# Patient Record
Sex: Female | Born: 1990 | Race: White | Hispanic: No | Marital: Married | Smoking: Current every day smoker
Health system: Southern US, Community
[De-identification: ages and names within clinical notes are randomized; demographics above are authoritative.]

## PROBLEM LIST (undated history)

## (undated) DIAGNOSIS — R112 Nausea with vomiting, unspecified: Secondary | ICD-10-CM

## (undated) DIAGNOSIS — R569 Unspecified convulsions: Secondary | ICD-10-CM

## (undated) DIAGNOSIS — Z9889 Other specified postprocedural states: Secondary | ICD-10-CM

## (undated) HISTORY — PX: NO PAST SURGERIES: SHX2092

## (undated) HISTORY — PX: KNEE SURGERY: SHX244

## (undated) HISTORY — PX: WISDOM TOOTH EXTRACTION: SHX21

---

## 2016-06-25 HISTORY — PX: CHOLECYSTECTOMY: SHX55

## 2019-01-15 DIAGNOSIS — Z720 Tobacco use: Secondary | ICD-10-CM | POA: Insufficient documentation

## 2019-06-09 DIAGNOSIS — Z8719 Personal history of other diseases of the digestive system: Secondary | ICD-10-CM | POA: Insufficient documentation

## 2019-06-11 LAB — OB RESULTS CONSOLE ABO/RH: RH Type: POSITIVE

## 2019-06-11 LAB — OB RESULTS CONSOLE HIV ANTIBODY (ROUTINE TESTING): HIV: NONREACTIVE

## 2019-06-11 LAB — OB RESULTS CONSOLE HEPATITIS B SURFACE ANTIGEN: Hepatitis B Surface Ag: NEGATIVE

## 2019-06-11 LAB — OB RESULTS CONSOLE RUBELLA ANTIBODY, IGM: Rubella: IMMUNE

## 2019-10-08 ENCOUNTER — Ambulatory Visit: Payer: Self-pay | Attending: Internal Medicine

## 2019-10-08 DIAGNOSIS — Z23 Encounter for immunization: Secondary | ICD-10-CM

## 2019-10-08 NOTE — Progress Notes (Signed)
   Covid-19 Vaccination Clinic  Name:  Cassandra Thomas    MRN: [id number] DOB: 1990/11/23  10/08/2019  Cassandra Thomas was observed post Covid-19 immunization for 15 minutes without incident. She was provided with Vaccine Information Sheet and instruction to access the V-Safe system.   Cassandra Thomas was instructed to call 911 with any severe reactions post vaccine: Marland Kitchen Difficulty breathing  . Swelling of face and throat  . A fast heartbeat  . A bad rash all over body  . Dizziness and weakness   Immunizations Administered    Name Date Dose VIS Date Route   Pfizer COVID-19 Vaccine 10/08/2019  5:04 PM 0.3 mL 06/05/2019 Intramuscular   Manufacturer: [employer], [employer]   Lot: [id number]   NDC: [address]-6

## 2019-11-02 ENCOUNTER — Ambulatory Visit: Payer: Self-pay

## 2019-11-04 ENCOUNTER — Ambulatory Visit: Payer: 59 | Attending: Internal Medicine

## 2019-11-04 DIAGNOSIS — Z23 Encounter for immunization: Secondary | ICD-10-CM

## 2019-11-04 NOTE — Progress Notes (Signed)
   Covid-19 Vaccination Clinic  Name:  Cassandra Thomas    MRN: [id number] DOB: 1990/09/22  11/04/2019  [name] was observed post Covid-19 immunization for 15 minutes without incident. She was provided with Vaccine Information Sheet and instruction to access the V-Safe system.   [name] was instructed to call 911 with any severe reactions post vaccine: Marland Kitchen Difficulty breathing  . Swelling of face and throat  . A fast heartbeat  . A bad rash all over body  . Dizziness and weakness   Immunizations Administered    Name Date Dose VIS Date Route   Pfizer COVID-19 Vaccine 11/04/2019  4:38 PM 0.3 mL 08/19/2018 Intramuscular   Manufacturer: [employer], [employer]   Lot: [id number]   NDC: [address]-9

## 2019-11-07 ENCOUNTER — Other Ambulatory Visit: Payer: Self-pay

## 2019-11-07 ENCOUNTER — Inpatient Hospital Stay (HOSPITAL_COMMUNITY)
Admission: AD | Admit: 2019-11-07 | Discharge: 2019-11-10 | DRG: 786 | Disposition: A | Discharge status: Home / Self Care | Payer: 59 | Source: Ambulatory Visit | Attending: Obstetrics & Gynecology | Admitting: Obstetrics & Gynecology

## 2019-11-07 ENCOUNTER — Encounter (HOSPITAL_COMMUNITY)
Admission: AD | Disposition: A | Discharge status: Home / Self Care | Payer: Self-pay | Source: Ambulatory Visit | Attending: Obstetrics & Gynecology

## 2019-11-07 ENCOUNTER — Inpatient Hospital Stay (HOSPITAL_COMMUNITY): Payer: 59 | Admitting: Anesthesiology

## 2019-11-07 ENCOUNTER — Encounter (HOSPITAL_COMMUNITY): Payer: Self-pay | Admitting: Obstetrics & Gynecology

## 2019-11-07 DIAGNOSIS — O99323 Drug use complicating pregnancy, third trimester: Secondary | ICD-10-CM | POA: Diagnosis present

## 2019-11-07 DIAGNOSIS — F1290 Cannabis use, unspecified, uncomplicated: Secondary | ICD-10-CM | POA: Diagnosis present

## 2019-11-07 DIAGNOSIS — O99354 Diseases of the nervous system complicating childbirth: Secondary | ICD-10-CM | POA: Diagnosis present

## 2019-11-07 DIAGNOSIS — O328XX0 Maternal care for other malpresentation of fetus, not applicable or unspecified: Secondary | ICD-10-CM | POA: Diagnosis not present

## 2019-11-07 DIAGNOSIS — O99334 Smoking (tobacco) complicating childbirth: Secondary | ICD-10-CM | POA: Diagnosis present

## 2019-11-07 DIAGNOSIS — F17290 Nicotine dependence, other tobacco product, uncomplicated: Secondary | ICD-10-CM | POA: Diagnosis present

## 2019-11-07 DIAGNOSIS — G40909 Epilepsy, unspecified, not intractable, without status epilepticus: Secondary | ICD-10-CM

## 2019-11-07 DIAGNOSIS — O321XX0 Maternal care for breech presentation, not applicable or unspecified: Secondary | ICD-10-CM | POA: Diagnosis present

## 2019-11-07 DIAGNOSIS — Z3A36 36 weeks gestation of pregnancy: Secondary | ICD-10-CM

## 2019-11-07 HISTORY — DX: Other specified postprocedural states: R11.2

## 2019-11-07 HISTORY — DX: Unspecified convulsions: R56.9

## 2019-11-07 HISTORY — DX: Nausea with vomiting, unspecified: Z98.890

## 2019-11-07 HISTORY — DX: Maternal care for breech presentation, not applicable or unspecified: O32.1XX0

## 2019-11-07 LAB — URINALYSIS, ROUTINE W REFLEX MICROSCOPIC
Bilirubin Urine: NEGATIVE
Glucose, UA: NEGATIVE mg/dL
Ketones, ur: 80 mg/dL — AB
Nitrite: NEGATIVE
Protein, ur: NEGATIVE mg/dL
Specific Gravity, Urine: 1.008 (ref 1.005–1.030)
pH: 7 (ref 5.0–8.0)

## 2019-11-07 LAB — RAPID URINE DRUG SCREEN, HOSP PERFORMED
Amphetamines: NOT DETECTED
Barbiturates: NOT DETECTED
Benzodiazepines: NOT DETECTED
Cocaine: NOT DETECTED
Opiates: NOT DETECTED
Tetrahydrocannabinol: POSITIVE — AB

## 2019-11-07 LAB — CBC
HCT: 37.7 % (ref 36.0–46.0)
Hemoglobin: 12.5 g/dL (ref 12.0–15.0)
MCH: 30.9 pg (ref 26.0–34.0)
MCHC: 33.2 g/dL (ref 30.0–36.0)
MCV: 93.3 fL (ref 80.0–100.0)
Platelets: 148 10*3/uL — ABNORMAL LOW (ref 150–400)
RBC: 4.04 MIL/uL (ref 3.87–5.11)
RDW: 12.9 % (ref 11.5–15.5)
WBC: 12.8 10*3/uL — ABNORMAL HIGH (ref 4.0–10.5)
nRBC: 0 % (ref 0.0–0.2)

## 2019-11-07 LAB — TYPE AND SCREEN
ABO/RH(D): O POS
Antibody Screen: NEGATIVE

## 2019-11-07 LAB — ABO/RH: ABO/RH(D): O POS

## 2019-11-07 SURGERY — Surgical Case
Anesthesia: Spinal | Site: Abdomen | Wound class: Clean Contaminated

## 2019-11-07 MED ORDER — ACETAMINOPHEN 325 MG PO TABS
650.0000 mg | ORAL_TABLET | Freq: Four times a day (QID) | ORAL | Status: DC | PRN
  Administered 2019-11-08: 650 mg via ORAL
  Filled 2019-11-07: qty 2

## 2019-11-07 MED ORDER — FENTANYL CITRATE (PF) 100 MCG/2ML IJ SOLN
INTRAMUSCULAR | Status: AC
  Filled 2019-11-07: qty 2

## 2019-11-07 MED ORDER — WITCH HAZEL-GLYCERIN EX PADS: 1.0000 "application " | MEDICATED_PAD | CUTANEOUS | Status: DC | PRN

## 2019-11-07 MED ORDER — GENERIC EXTERNAL MEDICATION
Status: DC
Start: ? — End: 2019-11-07

## 2019-11-07 MED ORDER — MORPHINE SULFATE (PF) 0.5 MG/ML IJ SOLN
INTRAMUSCULAR | Status: AC
  Filled 2019-11-07: qty 10

## 2019-11-07 MED ORDER — MEPERIDINE HCL 25 MG/ML IJ SOLN: 6.2500 mg | INTRAMUSCULAR | Status: DC | PRN

## 2019-11-07 MED ORDER — OXYTOCIN 40 UNITS IN NORMAL SALINE INFUSION - SIMPLE MED
INTRAVENOUS | Status: DC | PRN
  Administered 2019-11-07: 40 [IU] via INTRAVENOUS

## 2019-11-07 MED ORDER — LACTATED RINGERS IV SOLN
125.00 | INTRAVENOUS | Status: DC
Start: ? — End: 2019-11-07

## 2019-11-07 MED ORDER — OXYTOCIN 40 UNITS IN NORMAL SALINE INFUSION - SIMPLE MED
INTRAVENOUS | Status: AC
  Filled 2019-11-07: qty 1000

## 2019-11-07 MED ORDER — ONDANSETRON HCL 4 MG/2ML IJ SOLN: 4.0000 mg | Freq: Once | INTRAMUSCULAR | Status: DC | PRN

## 2019-11-07 MED ORDER — SODIUM CHLORIDE 0.9 % IV SOLN
INTRAVENOUS | Status: DC | PRN
Start: 2019-11-07 — End: 2019-11-07

## 2019-11-07 MED ORDER — PRENATAL MULTIVITAMIN CH
1.0000 | ORAL_TABLET | Freq: Every day | ORAL | Status: DC
  Administered 2019-11-08 – 2019-11-10 (×3): 1 via ORAL
  Filled 2019-11-07 (×3): qty 1

## 2019-11-07 MED ORDER — NALOXONE HCL 0.4 MG/ML IJ SOLN: 0.4000 mg | INTRAMUSCULAR | Status: DC | PRN

## 2019-11-07 MED ORDER — OXYCODONE HCL 5 MG PO TABS
5.0000 mg | ORAL_TABLET | ORAL | Status: DC | PRN
  Administered 2019-11-08 (×2): 5 mg via ORAL
  Administered 2019-11-09 (×2): 10 mg via ORAL
  Administered 2019-11-09: 5 mg via ORAL
  Administered 2019-11-09 – 2019-11-10 (×2): 10 mg via ORAL
  Administered 2019-11-10: 5 mg via ORAL
  Filled 2019-11-07: qty 2
  Filled 2019-11-07 (×4): qty 1
  Filled 2019-11-07 (×3): qty 2

## 2019-11-07 MED ORDER — BUPIVACAINE IN DEXTROSE 0.75-8.25 % IT SOLN
INTRATHECAL | Status: DC | PRN
  Administered 2019-11-07: 1.6 mL via INTRATHECAL

## 2019-11-07 MED ORDER — PHENYLEPHRINE HCL-NACL 20-0.9 MG/250ML-% IV SOLN
INTRAVENOUS | Status: DC | PRN
  Administered 2019-11-07: 60 ug/min via INTRAVENOUS

## 2019-11-07 MED ORDER — SIMETHICONE 80 MG PO CHEW
80.0000 mg | CHEWABLE_TABLET | ORAL | Status: DC
  Administered 2019-11-09 – 2019-11-10 (×2): 80 mg via ORAL
  Filled 2019-11-07 (×3): qty 1

## 2019-11-07 MED ORDER — DIPHENHYDRAMINE HCL 50 MG/ML IJ SOLN: 12.5000 mg | INTRAMUSCULAR | Status: DC | PRN

## 2019-11-07 MED ORDER — FAMOTIDINE 20 MG/2ML IV SOLN
20.00 | INTRAVENOUS | Status: DC
Start: ? — End: 2019-11-07

## 2019-11-07 MED ORDER — ACETAMINOPHEN 325 MG PO TABS
650.00 | ORAL_TABLET | ORAL | Status: DC
Start: ? — End: 2019-11-07

## 2019-11-07 MED ORDER — SENNOSIDES-DOCUSATE SODIUM 8.6-50 MG PO TABS
2.0000 | ORAL_TABLET | ORAL | Status: DC
  Administered 2019-11-09 – 2019-11-10 (×2): 2 via ORAL
  Filled 2019-11-07 (×3): qty 2

## 2019-11-07 MED ORDER — NALOXONE HCL 4 MG/10ML IJ SOLN
1.0000 ug/kg/h | INTRAVENOUS | Status: DC | PRN
  Filled 2019-11-07: qty 5

## 2019-11-07 MED ORDER — LACTATED RINGERS IV SOLN
1000.00 | INTRAVENOUS | Status: DC
Start: ? — End: 2019-11-07

## 2019-11-07 MED ORDER — DIPHENHYDRAMINE HCL 25 MG PO CAPS: 25.0000 mg | ORAL_CAPSULE | ORAL | Status: DC | PRN

## 2019-11-07 MED ORDER — LEVETIRACETAM 500 MG PO TABS
500.0000 mg | ORAL_TABLET | Freq: Two times a day (BID) | ORAL | Status: DC
  Administered 2019-11-08 – 2019-11-10 (×6): 500 mg via ORAL
  Filled 2019-11-07 (×8): qty 1

## 2019-11-07 MED ORDER — BENZOCAINE-MENTHOL 15-3.6 MG MT LOZG
1.00 | LOZENGE | OROMUCOSAL | Status: DC
Start: ? — End: 2019-11-07

## 2019-11-07 MED ORDER — KETOROLAC TROMETHAMINE 30 MG/ML IJ SOLN
30.0000 mg | Freq: Four times a day (QID) | INTRAMUSCULAR | Status: AC
  Administered 2019-11-08 (×2): 30 mg via INTRAVENOUS
  Filled 2019-11-07 (×2): qty 1

## 2019-11-07 MED ORDER — TETANUS-DIPHTH-ACELL PERTUSSIS 5-2.5-18.5 LF-MCG/0.5 IM SUSP: 0.5000 mL | Freq: Once | INTRAMUSCULAR | Status: DC

## 2019-11-07 MED ORDER — COCONUT OIL OIL: 1.0000 "application " | TOPICAL_OIL | Status: DC | PRN

## 2019-11-07 MED ORDER — TERBUTALINE SULFATE 1 MG/ML IJ SOLN
0.2500 mg | Freq: Once | INTRAMUSCULAR | Status: AC
  Administered 2019-11-07: 0.25 mg via SUBCUTANEOUS
  Filled 2019-11-07: qty 1

## 2019-11-07 MED ORDER — SIMETHICONE 80 MG PO CHEW
80.0000 mg | CHEWABLE_TABLET | Freq: Three times a day (TID) | ORAL | Status: DC
  Administered 2019-11-08 – 2019-11-10 (×7): 80 mg via ORAL
  Filled 2019-11-07 (×7): qty 1

## 2019-11-07 MED ORDER — SODIUM CHLORIDE 0.9 % IV SOLN
2.0000 g | Freq: Once | INTRAVENOUS | Status: AC
  Administered 2019-11-07: 2 g via INTRAVENOUS
  Filled 2019-11-07: qty 2

## 2019-11-07 MED ORDER — ONDANSETRON HCL 4 MG/2ML IJ SOLN: 4.0000 mg | Freq: Three times a day (TID) | INTRAMUSCULAR | Status: DC | PRN

## 2019-11-07 MED ORDER — SOD CITRATE-CITRIC ACID 500-334 MG/5ML PO SOLN
30.0000 mL | Freq: Once | ORAL | Status: AC
  Administered 2019-11-07: 30 mL via ORAL
  Filled 2019-11-07: qty 30

## 2019-11-07 MED ORDER — SCOPOLAMINE 1 MG/3DAYS TD PT72
1.0000 | MEDICATED_PATCH | Freq: Once | TRANSDERMAL | Status: DC
  Administered 2019-11-07: 1.5 mg via TRANSDERMAL

## 2019-11-07 MED ORDER — ZOLPIDEM TARTRATE 5 MG PO TABS: 5.0000 mg | ORAL_TABLET | Freq: Every evening | ORAL | Status: DC | PRN

## 2019-11-07 MED ORDER — MAGNESIUM HYDROXIDE 400 MG/5ML PO SUSP
30.00 | ORAL | Status: DC
Start: ? — End: 2019-11-07

## 2019-11-07 MED ORDER — MORPHINE SULFATE (PF) 0.5 MG/ML IJ SOLN
INTRAMUSCULAR | Status: DC | PRN
  Administered 2019-11-07: .15 mg via INTRATHECAL

## 2019-11-07 MED ORDER — LACTATED RINGERS IV BOLUS
1000.0000 mL | Freq: Once | INTRAVENOUS | Status: AC
  Administered 2019-11-07: 1000 mL via INTRAVENOUS

## 2019-11-07 MED ORDER — KETOROLAC TROMETHAMINE 30 MG/ML IJ SOLN
30.0000 mg | Freq: Once | INTRAMUSCULAR | Status: AC | PRN
  Administered 2019-11-07: 30 mg via INTRAVENOUS

## 2019-11-07 MED ORDER — SCOPOLAMINE 1 MG/3DAYS TD PT72
MEDICATED_PATCH | TRANSDERMAL | Status: AC
  Filled 2019-11-07: qty 1

## 2019-11-07 MED ORDER — MENTHOL 3 MG MT LOZG: 1.0000 | LOZENGE | OROMUCOSAL | Status: DC | PRN

## 2019-11-07 MED ORDER — KETOROLAC TROMETHAMINE 30 MG/ML IJ SOLN
INTRAMUSCULAR | Status: AC
  Filled 2019-11-07: qty 1

## 2019-11-07 MED ORDER — FENTANYL CITRATE (PF) 100 MCG/2ML IJ SOLN
25.0000 ug | INTRAMUSCULAR | Status: DC | PRN
  Administered 2019-11-07: 50 ug via INTRAVENOUS
  Administered 2019-11-07: 25 ug via INTRAVENOUS
  Administered 2019-11-07: 50 ug via INTRAVENOUS

## 2019-11-07 MED ORDER — BUTORPHANOL TARTRATE 1 MG/ML IJ SOLN
1.00 | INTRAMUSCULAR | Status: DC
Start: ? — End: 2019-11-07

## 2019-11-07 MED ORDER — ALBUTEROL SULFATE (2.5 MG/3ML) 0.083% IN NEBU
2.50 | INHALATION_SOLUTION | RESPIRATORY_TRACT | Status: DC
Start: ? — End: 2019-11-07

## 2019-11-07 MED ORDER — SODIUM CHLORIDE 0.9% FLUSH: 3.0000 mL | INTRAVENOUS | Status: DC | PRN

## 2019-11-07 MED ORDER — ONDANSETRON HCL 4 MG/2ML IJ SOLN
INTRAMUSCULAR | Status: AC
  Filled 2019-11-07: qty 2

## 2019-11-07 MED ORDER — OXYTOCIN 40 UNITS IN NORMAL SALINE INFUSION - SIMPLE MED: 2.5000 [IU]/h | INTRAVENOUS | Status: AC

## 2019-11-07 MED ORDER — DIBUCAINE (PERIANAL) 1 % EX OINT: 1.0000 "application " | TOPICAL_OINTMENT | CUTANEOUS | Status: DC | PRN

## 2019-11-07 MED ORDER — SIMETHICONE 80 MG PO CHEW: 80.0000 mg | CHEWABLE_TABLET | ORAL | Status: DC | PRN

## 2019-11-07 MED ORDER — SALINE NASAL SPRAY 0.65 % NA SOLN
2.00 | NASAL | Status: DC
Start: ? — End: 2019-11-07

## 2019-11-07 MED ORDER — IBUPROFEN 800 MG PO TABS
800.0000 mg | ORAL_TABLET | Freq: Four times a day (QID) | ORAL | Status: DC
  Administered 2019-11-08 – 2019-11-10 (×8): 800 mg via ORAL
  Filled 2019-11-07 (×8): qty 1

## 2019-11-07 MED ORDER — LEVETIRACETAM 500 MG PO TABS
500.00 | ORAL_TABLET | ORAL | Status: DC
Start: 2019-11-07 — End: 2019-11-07

## 2019-11-07 MED ORDER — DSS 100 MG PO CAPS
100.00 | ORAL_CAPSULE | ORAL | Status: DC
Start: 2019-11-07 — End: 2019-11-07

## 2019-11-07 MED ORDER — FAMOTIDINE IN NACL 20-0.9 MG/50ML-% IV SOLN
20.0000 mg | Freq: Once | INTRAVENOUS | Status: AC
  Administered 2019-11-07: 20 mg via INTRAVENOUS
  Filled 2019-11-07: qty 50

## 2019-11-07 MED ORDER — ONDANSETRON HCL 4 MG/2ML IJ SOLN
INTRAMUSCULAR | Status: DC | PRN
  Administered 2019-11-07: 4 mg via INTRAVENOUS

## 2019-11-07 MED ORDER — LACTATED RINGERS IV SOLN
500.00 | INTRAVENOUS | Status: DC
Start: ? — End: 2019-11-07

## 2019-11-07 MED ORDER — DIPHENHYDRAMINE HCL 25 MG PO CAPS: 25.0000 mg | ORAL_CAPSULE | Freq: Four times a day (QID) | ORAL | Status: DC | PRN

## 2019-11-07 MED ORDER — LACTATED RINGERS IV SOLN: INTRAVENOUS | Status: DC

## 2019-11-07 MED ORDER — ENOXAPARIN SODIUM 40 MG/0.4ML ~~LOC~~ SOLN
40.0000 mg | SUBCUTANEOUS | Status: DC
  Administered 2019-11-09: 40 mg via SUBCUTANEOUS
  Filled 2019-11-07: qty 0.4

## 2019-11-07 MED ORDER — PSYLLIUM 0.52 G PO CAPS
2.00 | ORAL_CAPSULE | ORAL | Status: DC
Start: ? — End: 2019-11-07

## 2019-11-07 MED ORDER — FENTANYL CITRATE (PF) 100 MCG/2ML IJ SOLN
INTRAMUSCULAR | Status: DC | PRN
  Administered 2019-11-07: 15 ug via INTRATHECAL

## 2019-11-07 MED ORDER — SOD CITRATE-CITRIC ACID 500-334 MG/5ML PO SOLN
30.00 | ORAL | Status: DC
Start: ? — End: 2019-11-07

## 2019-11-07 MED ORDER — PRENATAL 19 PO TABS
1.00 | ORAL_TABLET | ORAL | Status: DC
Start: 2019-11-08 — End: 2019-11-07

## 2019-11-07 MED ORDER — DEXAMETHASONE SODIUM PHOSPHATE 10 MG/ML IJ SOLN
INTRAMUSCULAR | Status: DC | PRN
  Administered 2019-11-07: 10 mg via INTRAVENOUS

## 2019-11-07 MED ORDER — PHENYLEPHRINE HCL-NACL 20-0.9 MG/250ML-% IV SOLN
INTRAVENOUS | Status: AC
  Filled 2019-11-07: qty 250

## 2019-11-07 MED ORDER — ZOLPIDEM TARTRATE 5 MG PO TABS
5.00 | ORAL_TABLET | ORAL | Status: DC
Start: ? — End: 2019-11-07

## 2019-11-07 MED ORDER — DEXAMETHASONE SODIUM PHOSPHATE 10 MG/ML IJ SOLN
INTRAMUSCULAR | Status: AC
  Filled 2019-11-07: qty 1

## 2019-11-07 SURGICAL SUPPLY — 33 items
CHLORAPREP W/TINT 26ML (MISCELLANEOUS) ×3 IMPLANT
CLAMP CORD UMBIL (MISCELLANEOUS) IMPLANT
CLOTH BEACON ORANGE TIMEOUT ST (SAFETY) ×3 IMPLANT
DRSG OPSITE POSTOP 4X10 (GAUZE/BANDAGES/DRESSINGS) ×3 IMPLANT
ELECT REM PT RETURN 9FT ADLT (ELECTROSURGICAL) ×3
ELECTRODE REM PT RTRN 9FT ADLT (ELECTROSURGICAL) ×1 IMPLANT
EXTRACTOR VACUUM M CUP 4 TUBE (SUCTIONS) IMPLANT
EXTRACTOR VACUUM M CUP 4' TUBE (SUCTIONS)
GLOVE BIOGEL PI IND STRL 7.0 (GLOVE) ×5 IMPLANT
GLOVE BIOGEL PI INDICATOR 7.0 (GLOVE) ×10
GLOVE ECLIPSE 7.0 STRL STRAW (GLOVE) ×12 IMPLANT
GOWN STRL REUS W/TWL LRG LVL3 (GOWN DISPOSABLE) ×6 IMPLANT
KIT ABG SYR 3ML LUER SLIP (SYRINGE) IMPLANT
NEEDLE HYPO 22GX1.5 SAFETY (NEEDLE) ×3 IMPLANT
NEEDLE HYPO 25X5/8 SAFETYGLIDE (NEEDLE) ×3 IMPLANT
NS IRRIG 1000ML POUR BTL (IV SOLUTION) ×3 IMPLANT
PACK C SECTION WH (CUSTOM PROCEDURE TRAY) ×3 IMPLANT
PAD ABD 7.5X8 STRL (GAUZE/BANDAGES/DRESSINGS) ×3 IMPLANT
PAD OB MATERNITY 4.3X12.25 (PERSONAL CARE ITEMS) ×3 IMPLANT
PENCIL SMOKE EVAC W/HOLSTER (ELECTROSURGICAL) ×3 IMPLANT
RTRCTR C-SECT PINK 25CM LRG (MISCELLANEOUS) IMPLANT
SPONGE GAUZE 4X4 12PLY (GAUZE/BANDAGES/DRESSINGS) ×3 IMPLANT
SUT PDS AB 0 CTX 36 PDP370T (SUTURE) IMPLANT
SUT PLAIN 2 0 XLH (SUTURE) IMPLANT
SUT VIC AB 0 CTX 36 (SUTURE) ×4
SUT VIC AB 0 CTX36XBRD ANBCTRL (SUTURE) ×2 IMPLANT
SUT VIC AB 2-0 CT1 27 (SUTURE) ×2
SUT VIC AB 2-0 CT1 TAPERPNT 27 (SUTURE) ×1 IMPLANT
SUT VIC AB 4-0 KS 27 (SUTURE) ×3 IMPLANT
SYR CONTROL 10ML LL (SYRINGE) ×3 IMPLANT
TOWEL OR 17X24 6PK STRL BLUE (TOWEL DISPOSABLE) ×3 IMPLANT
TRAY FOLEY W/BAG SLVR 14FR LF (SET/KITS/TRAYS/PACK) ×3 IMPLANT
WATER STERILE IRR 1000ML POUR (IV SOLUTION) ×3 IMPLANT

## 2019-11-07 NOTE — Transfer of Care (Signed)
Immediate Anesthesia Transfer of Care Note  Patient: Cassandra Thomas  Procedure(s) Performed: CESAREAN SECTION (N/A Abdomen)  Patient Location: PACU  Anesthesia Type:Spinal  Level of Consciousness: awake, alert , oriented and patient cooperative  Airway & Oxygen Therapy: Patient Spontanous Breathing  Post-op Assessment: Report given to RN and Post -op Vital signs reviewed and stable  Post vital signs: Reviewed and stable  Last Vitals:  Vitals Value Taken Time  BP 126/102 11/07/19 2047  Temp    Pulse 87 11/07/19 2049  Resp 17 11/07/19 2049  SpO2 100 % 11/07/19 2049  Vitals shown include unvalidated device data.  Last Pain:  Vitals:   11/07/19 1846  TempSrc:   PainSc: 2          Complications: No apparent anesthesia complications

## 2019-11-07 NOTE — Anesthesia Procedure Notes (Signed)
Spinal  Patient location during procedure: OR Staffing Performed: anesthesiologist  Anesthesiologist: Julietta Batterman E, MD Preanesthetic Checklist Completed: patient identified, IV checked, risks and benefits discussed, surgical consent, monitors and equipment checked, pre-op evaluation and timeout performed Spinal Block Patient position: sitting Prep: DuraPrep and site prepped and draped Patient monitoring: continuous pulse ox, blood pressure and heart rate Approach: midline Location: L3-4 Injection technique: single-shot Needle Needle type: Pencan  Needle gauge: 24 G Needle length: 9 cm Additional Notes Functioning IV was confirmed and monitors were applied. Sterile prep and drape, including hand hygiene and sterile gloves were used. The patient was positioned and the spine was prepped. The skin was anesthetized with lidocaine.  Free flow of clear CSF was obtained prior to injecting local anesthetic into the CSF. The needle was carefully withdrawn. The patient tolerated the procedure well.      

## 2019-11-07 NOTE — Anesthesia Postprocedure Evaluation (Signed)
Anesthesia Post Note  Patient: Cassandra Thomas  Procedure(s) Performed: CESAREAN SECTION (N/A Abdomen)     Patient location during evaluation: PACU Anesthesia Type: Spinal Level of consciousness: oriented and awake and alert Pain management: pain level controlled Vital Signs Assessment: post-procedure vital signs reviewed and stable Respiratory status: spontaneous breathing, respiratory function stable and nonlabored ventilation Cardiovascular status: blood pressure returned to baseline and stable Postop Assessment: no headache, no backache, no apparent nausea or vomiting and spinal receding Anesthetic complications: no    Last Vitals:  Vitals:   11/07/19 2120 11/07/19 2130  BP: 116/76 116/79  Pulse: 69 64  Resp: 18 16  Temp:    SpO2: 100% 100%    Last Pain:  Vitals:   11/07/19 2120  TempSrc:   PainSc: 4    Pain Goal:                Epidural/Spinal Function Cutaneous sensation: Able to Wiggle Toes (11/07/19 2120), Patient able to flex knees: Yes (11/07/19 2120), Patient able to lift hips off bed: No (11/07/19 2120), Back pain beyond tenderness at insertion site: No (11/07/19 2120), Progressively worsening motor and/or sensory loss: No (11/07/19 2120), Bowel and/or bladder incontinence post epidural: No (11/07/19 2120)  Lucretia Kern

## 2019-11-07 NOTE — Op Note (Addendum)
Claudius Sis PROCEDURE DATE: 11/07/2019  PREOPERATIVE DIAGNOSES: Intrauterine pregnancy at [redacted]w[redacted]d weeks gestation; breech presentation and laboring  POSTOPERATIVE DIAGNOSES: The same  PROCEDURE: Primary Low Transverse Cesarean Section  SURGEON:  Dr. Jaynie Collins - Primary Dr. Jerilynn Thomas - Fellow  ANESTHESIOLOGY TEAM: Anesthesiologist: Lucretia Kern, MD CRNA: Orlie Pollen, CRNA  INDICATIONS: Cassandra Thomas is a 29 y.o. [phone] at [redacted]w[redacted]d here for cesarean section secondary to the indications listed under preoperative diagnoses; please see preoperative note for further details.  The risks of cesarean section were discussed with the patient including but were not limited to: bleeding which may require transfusion or reoperation; infection which may require antibiotics; injury to bowel, bladder, ureters or other surrounding organs; injury to the fetus; need for additional procedures including hysterectomy in the event of a life-threatening hemorrhage; placental abnormalities wth subsequent pregnancies, incisional problems, thromboembolic phenomenon and other postoperative/anesthesia complications.   The patient concurred with the proposed plan, giving informed written consent for the procedure.    FINDINGS:  Viable female infant in complete breech presentation. Clear amniotic fluid.  Intact placenta, three vessel cord.  Normal uterus, fallopian tubes and ovaries bilaterally. APGAR (1 MIN): 9   APGAR (5 MINS): 9   APGAR (10 MINS):    ANESTHESIA: Spinal INTRAVENOUS FLUIDS: 1000 ml   ESTIMATED BLOOD LOSS: 333 ml URINE OUTPUT:  100 ml SPECIMENS: Placenta sent to L&D COMPLICATIONS: None immediate  PROCEDURE IN DETAIL:  The patient preoperatively received intravenous antibiotics and had sequential compression devices applied to her lower extremities.  She was then taken to the operating room where spinal anesthesia was administered and was found to be adequate. She was then  placed in a dorsal supine position with a leftward tilt, and prepped and draped in a sterile manner.  A foley catheter was placed into her bladder and attached to constant gravity.  After an adequate timeout was performed, a Pfannenstiel skin incision was made with scalpel and carried through to the underlying layer of fascia. The fascia was incised in the midline, and this incision was extended bilaterally using blunt traction. The rectus muscles were separated in the midline and the peritoneum was entered bluntly. The Alexis self-retaining retractor was introduced into the abdominal cavity.  Attention was turned to the lower uterine segment where a low transverse hysterotomy was made with a scalpel and extended bilaterally bluntly.  The infant was successfully delivered from complete breech presentation, the cord was clamped and cut after one minute, and the infant was handed over to the awaiting neonatology team. Uterine massage was then administered, and the placenta delivered intact with a three-vessel cord. The uterus was then cleared of clots and debris.  The hysterotomy was closed with 0 Vicryl in a running locked fashion, and an imbricating layer was also placed with 0 Vicryl. The pelvis was cleared of all clot and debris. Hemostasis was confirmed on all surfaces.  The retractor was removed.  The peritoneum was closed with a 2-0 Vicryl running stitch. The fascia was then closed using 0 Vicryl in a running fashion.  The subcutaneous layer was irrigated and reapproximated with 2-0 plain gut interrupted running stitches. The skin was closed with a 4-0 Vicryl subcuticular stitch. The patient tolerated the procedure well. Sponge, instrument and needle counts were correct x 3.  She was taken to the recovery room in stable condition.   Cassandra Birkenhead, MD [facility] Family Medicine Fellow, [facility] for [employer], [facility]

## 2019-11-07 NOTE — MAU Note (Signed)
Was at [facility] yesterday.  Ctx's have been 3 min apart since yesterday.  Have gotten stronger today. Was 3 cm. Pt is breech, plan for c/s  Next wk.  Became frustrated with the inconsistencies, so came here. Scant bloody mucous, denies LOF

## 2019-11-07 NOTE — Anesthesia Preprocedure Evaluation (Signed)
Anesthesia Evaluation  Patient identified by MRN, date of birth, ID band Patient awake    Reviewed: Allergy & Precautions, NPO status , Patient's Chart, lab work & pertinent test results  History of Anesthesia Complications (+) PONVNegative for: history of anesthetic complications  Airway Mallampati: II  TM Distance: >3 FB Neck ROM: Full    Dental   Pulmonary Current Smoker,    Pulmonary exam normal        Cardiovascular negative cardio ROS Normal cardiovascular exam     Neuro/Psych Seizures -,  negative psych ROS   GI/Hepatic negative GI ROS, Neg liver ROS,   Endo/Other  negative endocrine ROS  Renal/GU negative Renal ROS  negative genitourinary   Musculoskeletal negative musculoskeletal ROS (+)   Abdominal   Peds  Hematology negative hematology ROS (+)   Anesthesia Other Findings  C/S for breech  Reproductive/Obstetrics (+) Pregnancy                            Anesthesia Physical Anesthesia Plan  ASA: II and emergent  Anesthesia Plan: Spinal   Post-op Pain Management:    Induction:   PONV Risk Score and Plan: 3 and Ondansetron and Treatment may vary due to age or medical condition  Airway Management Planned: Natural Airway  Additional Equipment: None  Intra-op Plan:   Post-operative Plan:   Informed Consent: I have reviewed the patients History and Physical, chart, labs and discussed the procedure including the risks, benefits and alternatives for the proposed anesthesia with the patient or authorized representative who has indicated his/her understanding and acceptance.       Plan Discussed with:   Anesthesia Plan Comments:         Anesthesia Quick Evaluation

## 2019-11-07 NOTE — H&P (Signed)
Obstetric Preoperative History and Physical  Cassandra Thomas is a 29 y.o. G2P0010 with IUP at [redacted]w[redacted]d presenting in active preterm labor, known breech presentation. Gets her care at [facility].  Has been offered external cephalic version, she declined this and desires cesarean delivery. Was observed yesterday at [facility] and she had no cervical change.  Today, she presented here with worsening contractions.  Reports good fetal movement, no bleeding, no leaking of fluid.  No acute preoperative concerns.  Last meal was at 0900 today. She just received terbutaline x 1 in MAU, had negative COVID test at [facility].   Cesarean Section Indication: malpresentation: breech  Prenatal Course Source of Care: [facility] Pregnancy complications or risks: Breech presentation History of seizure disorder. She plans to breastfeed She is undecided for postpartum contraception.   Prenatal labs and studies: ABO, Rh: O pos Antibody: negative Rubella:  immune RPR:   NR HBsAg:   neg HIV:   NR GBS: neg 1 hr Glucola  121 Genetic screening normal Anatomy US normal  Normal pap 06/11/2019  Prenatal Transfer Tool  Maternal Diabetes: No Genetic Screening: Normal Maternal Ultrasounds/Referrals: Normal Fetal Ultrasounds or other Referrals:  None Maternal Substance Abuse:  Yes:  Type: Marijuana in 05/2019 Significant Maternal Medications:  None Significant Maternal Lab Results: Group B Strep negative  Past Medical History:  Diagnosis Date  . PONV (postoperative nausea and vomiting)   . Seizures (HCC)    last seizure 3 years ago    Past Surgical History:  Procedure Laterality Date  . NO PAST SURGERIES      OB History  Gravida Para Term Preterm AB Living  2       1    SAB TAB Ectopic Multiple Live Births  1            # Outcome Date GA Lbr Len/2nd Weight Sex Delivery Anes PTL Lv  2 Current           1 SAB             Social History   Socioeconomic History  . Marital status: Married    Spouse name:  Analei Whinery  . Number of children: Not on file  . Years of education: Not on file  . Highest education level: Not on file  Occupational History  . Occupation: unemployed  Tobacco Use  . Smoking status: Current Every Day Smoker    Years: 6.00    Types: E-cigarettes  . Smokeless tobacco: Never Used  Substance and Sexual Activity  . Alcohol use: Never  . Drug use: Yes    Frequency: 4.0 times per week    Types: Marijuana    Comment: last was 3 days ago  . Sexual activity: Yes  Other Topics Concern  . Not on file  Social History Narrative  . Not on file   Social Determinants of Health   Financial Resource Strain:   . Difficulty of Paying Living Expenses:   Food Insecurity:   . Worried About Programme researcher, broadcasting/film/video in the Last Year:   . Barista in the Last Year:   Transportation Needs:   . Freight forwarder (Medical):   Marland Kitchen Lack of Transportation (Non-Medical):   Physical Activity:   . Days of Exercise per Week:   . Minutes of Exercise per Session:   Stress:   . Feeling of Stress :   Social Connections:   . Frequency of Communication with Friends and Family:   . Frequency of  Social Gatherings with Friends and Family:   . Attends Religious Services:   . Active Member of Clubs or Organizations:   . Attends Archivist Meetings:   Marland Kitchen Marital Status:     Family History  Problem Relation Age of Onset  . Cancer Maternal Grandmother     No medications prior to admission.    Allergies  Allergen Reactions  . Codeine Hives    Review of Systems: Pertinent items noted in HPI and remainder of comprehensive ROS otherwise negative.  Physical Exam: BP 133/79   Pulse (!) 110   Temp 98.6 F (37 C) (Oral)   Resp 18   Ht 5\' 9"  (1.753 m)   Wt 67.5 kg   SpO2 100%   BMI 21.97 kg/m  FHR tracing: Category I reactive tracing in MAU CONSTITUTIONAL: Well-developed, well-nourished female in no acute distress.  HENT:  Normocephalic, atraumatic, External right  and left ear normal. Oropharynx is clear and moist EYES: Conjunctivae and EOM are normal. Pupils are equal, round, and reactive to light. No scleral icterus.  NECK: Normal range of motion, supple, no masses SKIN: Skin is warm and dry. No rash noted. Not diaphoretic. No erythema. No pallor. [place]: Alert and oriented to person, place, and time. Normal reflexes, muscle tone coordination. No cranial nerve deficit noted. PSYCHIATRIC: Normal mood and affect. Normal behavior. Normal judgment and thought content. CARDIOVASCULAR: Normal heart rate noted, regular rhythm RESPIRATORY: Effort and breath sounds normal, no problems with respiration noted ABDOMEN: Soft, nontender, nondistended, gravid. PELVIC: Deferred MUSCULOSKELETAL: Normal range of motion. No edema and no tenderness. 2+ distal pulses.   Pertinent Labs/Studies:   Results for orders placed or performed during the hospital encounter of 11/07/19 (from the past 72 hour(s))  Type and screen [place]     Status: None (Preliminary result)   Collection Time: 11/07/19  6:25 PM  Result Value Ref Range   ABO/RH(D) PENDING    Antibody Screen PENDING    Sample Expiration      11/10/2019,2359 Performed at [facility], [place] [address], [place], [place] [address]   CBC     Status: Abnormal   Collection Time: 11/07/19  6:29 PM  Result Value Ref Range   WBC 12.8 (H) 4.0 - 10.5 K/uL   RBC 4.04 3.87 - 5.11 MIL/uL   Hemoglobin 12.5 12.0 - 15.0 g/dL   HCT 37.7 36.0 - 46.0 %   MCV 93.3 80.0 - 100.0 fL   MCH 30.9 26.0 - 34.0 pg   MCHC 33.2 30.0 - 36.0 g/dL   RDW 12.9 11.5 - 15.5 %   Platelets 148 (L) 150 - 400 K/uL   nRBC 0.0 0.0 - 0.2 %    Comment: Performed at [facility], [place] [address] [place], [place] [address]    Assessment and Plan: Cassandra Thomas is a 29 y.o. G2P0010 at [redacted]w[redacted]d being admitted for scheduled cesarean section. The risks of cesarean section discussed with the patient included but were  not limited to: bleeding which may require transfusion or reoperation; infection which may require antibiotics; injury to bowel, bladder, ureters or other surrounding organs; injury to the fetus; need for additional procedures including hysterectomy in the event of a life-threatening hemorrhage; placental abnormalities with subsequent pregnancies, incisional problems, formation of adhesive disease, thromboembolic phenomenon and other postoperative/anesthesia complications. The patient concurred with the proposed plan, giving informed written consent for the procedure. Patient has been NPO since last night she will remain  NPO for procedure. Anesthesia and OR aware. Preoperative prophylactic antibiotics and SCDs ordered on call to the OR. To OR when ready.    Jaynie Collins, MD, FACOG Obstetrician & Gynecologist, [facility] for [employer], [facility]

## 2019-11-07 NOTE — Discharge Summary (Signed)
Postpartum Discharge Summary       Patient Name: Cassandra Thomas DOB: December 06, 1990 MRN: [id number]  Date of admission: 11/07/2019 Delivery date:11/07/2019  Delivering provider: Verita Schneiders A  Date of discharge: 11/10/2019  Admitting diagnosis: Labor and delivery, indication for care [O75.9] Intrauterine pregnancy: [redacted]w[redacted]d    Secondary diagnosis:  Active Problems:   Seizure disorder ([facility]   Breech presentation   Labor and delivery, indication for care   [redacted] weeks gestation of pregnancy  Additional problems: None    Discharge diagnosis: Preterm Pregnancy Delivered                                              Post partum procedures:None Augmentation: N/A Complications: None  Hospital course: Onset of Labor With Unplanned C/S   29y.o. yo G2P0111 at [id number] admitted in Latent Labor on 11/07/2019. She had known breech presentation and had been planning a primary c-section. The patient went for cesarean section due to Malpresentation. Delivery details as follows: Membrane Rupture Time/Date: 7:50 PM ,11/07/2019   Delivery Method:C-Section, Low Transverse  Details of operation can be found in separate operative note. Patient had an uncomplicated postpartum course.  She is ambulating,tolerating a regular diet, passing flatus, and urinating well.  Patient is discharged home in stable condition 11/10/19.  Newborn Data: Birth date:11/07/2019  Birth time:7:51 PM  Gender:Female  Living status:Living  Apgars:9 ,9   Magnesium Sulfate received: No BMZ received: No Rhophylac:No MMR:No T-DaP:Given prenatally Flu: No Transfusion:No  Physical exam  Vitals:   11/09/19 0637 11/09/19 1345 11/10/19 0011 11/10/19 0530  BP: 111/84 (!) 143/90 134/88 120/76  Pulse: 75 74 72 92  Resp: 18 18 16 16   Temp: 98.8 F (37.1 C) 98.7 F (37.1 C) 98 F (36.7 C) 99.5 F (37.5 C)  TempSrc: Oral Oral Oral Oral  SpO2: 97%     Weight:      Height:       General: alert, cooperative and no  distress Lochia: appropriate Uterine Fundus: firm Incision: Healing well with no significant drainage, No significant erythema, Dressing is clean, dry, and intact DVT Evaluation: No evidence of DVT seen on physical exam. Negative Homan's sign. No cords or calf tenderness. Labs: Lab Results  Component Value Date   WBC 18.5 (H) 11/08/2019   HGB 11.4 (L) 11/08/2019   HCT 34.1 (L) 11/08/2019   MCV 93.4 11/08/2019   PLT 146 (L) 11/08/2019   CMP Latest Ref Rng & Units 11/08/2019  Creatinine 0.44 - 1.00 mg/dL 0.80   Edinburgh Score: Edinburgh Postnatal Depression Scale Screening Tool 11/08/2019  I have been able to laugh and see the funny side of things. 1  I have looked forward with enjoyment to things. 0  I have blamed myself unnecessarily when things went wrong. 2  I have been anxious or worried for no good reason. 2  I have felt scared or panicky for no good reason. 1  Things have been getting on top of me. 1  I have been so unhappy that I have had difficulty sleeping. 0  I have felt sad or miserable. 1  I have been so unhappy that I have been crying. 1  The thought of harming myself has occurred to me. 0  Edinburgh Postnatal Depression Scale Total 9     After visit meds:  Allergies as of  11/10/2019      Reactions   Codeine Hives      Medication List    TAKE these medications   ibuprofen 800 MG tablet Commonly known as: ADVIL Take 1 tablet (800 mg total) by mouth every 6 (six) hours.   levETIRAcetam 500 MG tablet Commonly known as: KEPPRA Take 500 mg by mouth in the morning and at bedtime.   oxyCODONE 5 MG immediate release tablet Commonly known as: Oxy IR/ROXICODONE Take 1-2 tablets (5-10 mg total) by mouth every 6 (six) hours as needed for moderate pain or severe pain.   prenatal multivitamin Tabs tablet Take 1 tablet by mouth daily at 12 noon.        Discharge home in stable condition Infant Feeding: Breast Infant Disposition:home with mother Discharge  instruction: per After Visit Summary and Postpartum booklet. Activity: Advance as tolerated. Pelvic rest for 6 weeks.  Diet: routine diet Future Appointments: Future Appointments  Date Time Provider [place]  11/20/2019 10:20 AM [facility] NURSE [facility]  12/10/2019 10:55 AM Rasch, Artist Pais, NP [facility]   Follow up Visit:   Please schedule this patient for a Virtual postpartum visit in 4 weeks with the following provider: Any provider. Additional Postpartum F/U:Incision check 1 week  Low risk pregnancy complicated by: none Delivery mode:  C-Section, Low Transverse  Anticipated Birth Control:  Unsure   11/10/2019 Christin Fudge, CNM

## 2019-11-07 NOTE — MAU Provider Note (Signed)
First Provider Initiated Contact with Patient 11/07/19 1824       S: [name] is a 29 y.o. G2P0010 at [redacted]w[redacted]d  who presents to MAU today complaining contractions q 3-4 minutes since 1100. She was seen at [facility] and observed for 12 hours with no cervical change and discharged home this am. She endorses vaginal bleeding. She denies LOF. She reports normal fetal movement.    O: BP (!) 147/87   Pulse 78   Temp 98.6 F (37 C) (Oral)   Resp 18   Ht 5\' 9"  (1.753 m)   Wt 67.5 kg   SpO2 100%   BMI 21.97 kg/m  GENERAL: Well-developed, well-nourished female in no acute distress.  HEAD: Normocephalic, atraumatic.  CHEST: Normal effort of breathing, regular heart rate ABDOMEN: Soft, nontender, gravid  Cervical exam:  Dilation: 4.5 Effacement (%): 90 Exam by:: [email or web address], C CNM  Bulging bag of water  Breech presentation confirmed with bedside u/s.   Fetal Monitoring: Baseline: 135 Variability: moderate Accelerations: 15x15 Decelerations: none Contractions: 3-5   A: SIUP at [redacted]w[redacted]d  Labor and breech presentation  Discussed with patient options of ECV and primary c/s. Patient states she does not want ECV and wants to proceed with c/s.   P: Dr. [redacted]w[redacted]d notified of patient arrival and exam. Will prep patient for primary c/s due to breech presentation. Terbutaline and IV fluids given to slow contractions. COVID swab at [facility] last night negative. NICU notified of patient arrival.  Plan of care discussed with patient and patient agreeable. Admission labs ordered.   Macon Large, Rolm Bookbinder 11/07/2019 6:55 PM

## 2019-11-08 LAB — CBC
HCT: 34.1 % — ABNORMAL LOW (ref 36.0–46.0)
Hemoglobin: 11.4 g/dL — ABNORMAL LOW (ref 12.0–15.0)
MCH: 31.2 pg (ref 26.0–34.0)
MCHC: 33.4 g/dL (ref 30.0–36.0)
MCV: 93.4 fL (ref 80.0–100.0)
Platelets: 146 10*3/uL — ABNORMAL LOW (ref 150–400)
RBC: 3.65 MIL/uL — ABNORMAL LOW (ref 3.87–5.11)
RDW: 12.8 % (ref 11.5–15.5)
WBC: 18.5 10*3/uL — ABNORMAL HIGH (ref 4.0–10.5)
nRBC: 0 % (ref 0.0–0.2)

## 2019-11-08 LAB — CREATININE, SERUM
Creatinine, Ser: 0.8 mg/dL (ref 0.44–1.00)
GFR calc Af Amer: >60 mL/min (ref >60)
GFR calc non Af Amer: >60 mL/min (ref >60)

## 2019-11-08 LAB — RPR: RPR Ser Ql: NONREACTIVE

## 2019-11-08 NOTE — Clinical Social Work Maternal (Signed)
CLINICAL SOCIAL WORK MATERNAL/CHILD NOTE  Patient Details  Name: Cassandra Thomas MRN: [id number] Date of Birth: 10-19-1990  Date:  11/08/2019  Clinical Social Worker Initiating Note:  Georgeanne Nim, MSW, LCSWA Date/Time: Initiated:  11/08/19/1200     Child's Name:  Cassandra Thomas   Biological Parents:  Mother, Father(FOB, Ima Hafner, 06-05-1988)   Need for Interpreter:  None   Reason for Referral:  Current Substance Use/Substance Use During Pregnancy    Address:  [place] [address]    Phone number:  [phone] (home)     Additional phone number: [phone]  Household Members/Support Persons (HM/SP):   Household Member/Support Person 1   HM/SP Name Relationship DOB or Age  HM/SP -[place] FOB 06-05-1988  HM/SP -2        HM/SP -3        HM/SP -4        HM/SP -5        HM/SP -6        HM/SP -7        HM/SP -8          Natural Supports (not living in the home):  Immediate Family, Extended Family, Friends, Artist Supports:     Employment: Unemployed   Type of Work:     Education:  Public librarian arranged:    Museum/gallery curator Resources:  Multimedia programmer   Other Resources:      Cultural/Religious Considerations Which May Impact Care:  none  Strengths:  Ability to meet basic needs , Home prepared for child    Psychotropic Medications:         Pediatrician:       Pediatrician List:   [place]      Pediatrician Fax Number:    Risk Factors/Current Problems:  Substance Use    Cognitive State:  Able to Concentrate , Alert    Mood/Affect:  Calm , Happy , Interested    CSW Assessment: CSW received consult for THC use during pregnancy and [place] score of 9.  CSW met with MOB to offer support and complete assessment. FOB and infant, Skylar were present at time of visit, however,  after PPD and SIDS education, FOB stepped out of room to offer MOB privacy during assessment. MOB and FOB were pleasant and engaged during visit.   MOB reported THC use during pregnancy. MOB reported THC helped her maintain appropriate weight and appetite during pregnancy. 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 MOB declined resources for substance treatment and support. MOB denied use of any other substances. MOB reported last use as 11/05/2019.  CSW informed MOB of hospital infant drug screen policy, infant's pending UDS and CDS results, and CPS report if warranted. MOB stated understanding and denied any questions. MOB denied any CPS history.     MOB reported [place] history of anxiety and bulimia. MOB reported managing sx with marijuana and brief period of Fluoxetine. MOB reported marijuana use help keep her calm and eating. MOB reports bulimia as well controlled for over 3 years. MOB reported participating in a seven-week outpatient treatment program in 2017 for bulimia. MOB reported program was successful. MOB denied any other BH dx or concerns. MOB identified mood as "happy ". MOB denies any SI, HI, or domestic violence.  MOB identified FOB, parents, best-friend, brother, and extended family as support. MOB declined [place] resources at this time. MOB reported access to previous therapist, Cannon Kettle if needed.    CSW provided education regarding the baby blues period vs. perinatal mood disorders, discussed treatment and gave resources for mental health follow up if concerns arise.  CSW recommends self-evaluation during the postpartum time period using the New Mom Checklist from Postpartum Progress and encouraged MOB and FOB to contact a medical professional if symptoms are noted at any time. FOB and MOB denied any questions.   CSW provided review of Sudden Infant Death Syndrome (SIDS) precautions. MOB and FOB confirmed having all needed items for baby including car seat and bassinet  and for baby's safe sleep.     CSW will continue to monitor UDS and CDS results and make CPS report if warranted.   CSW Plan/Description:  Sudden Infant Death Syndrome (SIDS) Education, Perinatal Mood and Anxiety Disorder (PMADs) Education, [place], CSW Will Continue to Monitor Umbilical Cord Tissue Drug Screen Results and Make Report if Warranted    Keyon Liller D. Lissa Morales, MSW, [facility] Clinical Social Worker [phone] 11/08/2019, 3:48 PM

## 2019-11-08 NOTE — Lactation Note (Signed)
This note was copied from a baby's chart. Lactation Consultation Note  Patient Name: Cassandra Thomas Today's Date: 11/08/2019     [facility] entered room and mom immediately requested to be DC'd tonight. LC let mom know she would call her RN. Security followed in shortly afterwards and discussed with mom situation with partner.  LC then introduced herself and Mom states she would really like to talk to me due to difficulty latching infant.  Charge RN at bedside and LC offered to come back so mom could speak privately with RN.  Will check back later this evening.   Maternal Data    Feeding    LATCH Score                   Interventions    Lactation Tools Discussed/Used     Consult Status      Maryruth Hancock [facility] 11/08/2019, 9:43 PM

## 2019-11-08 NOTE — Lactation Note (Signed)
This note was copied from a baby's chart. Lactation Consultation Note  Patient Name: Cassandra Thomas Date: 11/08/2019 Reason for consult: Initial assessment;1st time breastfeeding;Late-preterm 34-36.6wks P1, 8 hour LPTI female. Tools given: breast shells, 20 mm NS, hand pump and DEBP due to having flat nipples, infant not sustaining latch without NS at first and infant being LPTI. Mom is breastfeeding and supplementing with Similac Neosure 22 kcal with iron formula.  LC did not see latch at this time,  per mom, infant breastfed for 10 minutes, then was supplemented with 7 mls of EBM and 2 mls of  22 kcal Similac Neosure with iron. Mom knows to use her personal DEBP every 3 hours for 15 minutes on initial setting to help establish her milk supply.  Mom will follow LPTI feeding policy ( green sheet given), mom will do STS, limit feeding 30 minutes or less and not make infant wait to feed, mom will not exceed 3 hours without feeding infant. Parents will wait until 3 weeks before offering pacifer. When supplementing infant with EBM/ and  formula mom is using a slow flow nipple. Mom knows to call RN or LC if she needs assistance with latching infant at breast. Mom made aware of O/P services, breastfeeding support groups, community resources, and our phone # for post-discharge questions.    Maternal Data Formula Feeding for Exclusion: Yes Reason for exclusion: Mother's choice to formula and breast feed on admission Has patient been taught Hand Expression?: Yes Does the patient have breastfeeding experience prior to this delivery?: No  Feeding Feeding Type: Bottle Fed - Formula Nipple Type: Slow - flow  LATCH Score Latch: Repeated attempts needed to sustain latch, nipple held in mouth throughout feeding, stimulation needed to elicit sucking reflex.  Audible Swallowing: None  Type of Nipple: Flat  Comfort (Breast/Nipple): Soft / non-tender  Hold (Positioning): Assistance needed  to correctly position infant at breast and maintain latch.  LATCH Score: 5  Interventions Interventions: Skin to skin;DEBP;Hand pump;Shells;Pre-pump if needed;Hand express;Expressed milk;Position options;Breast feeding basics reviewed  Lactation Tools Discussed/Used Tools: Shells;Pump;Nipple Shields Nipple shield size: 20 Shell Type: Other (comment)(flat) Breast pump type: Double-Electric Breast Pump;Manual WIC Program: No Pump Review: Setup, frequency, and cleaning;Milk Storage Initiated by:: by RN Date initiated:: 11/07/19   Consult Status Consult Status: Follow-up Date: 11/08/19 Follow-up type: In-patient    Danelle Earthly 11/08/2019, 4:02 AM

## 2019-11-08 NOTE — Lactation Note (Signed)
This note was copied from a baby's chart. Lactation Consultation Note  Patient Name: Cassandra Thomas [id number] Date: 11/08/2019 Reason for consult: Follow-up assessment    Within a minute of LC entering, infant had two large emesis' of digested formula/Breastmilk.   LC suggested putting infant STS.   Mom states she has pumped 51ml and gave that along with 20 mls of formula at 5 pm.  LC praised mom's  efforts for pumping and collecting milk.  LPTI guidelines for infant along with LPTI behavior were reviewed.  Discussed more frequent feeding, (with cues and/or every three hours), and less amount.  Paced bottle feeding discussed.  LC encouraged mom to utilize our DEBP for stimulating her milk supply and encouraged her to hand express.  Partner entered room and was active in the conversation.  All questions answered.  Encouraged mom to pump each time infant receives a bottle/supplement.         Maternal Data    Feeding    LATCH Score                   Interventions Interventions: Breast feeding basics reviewed;DEBP;Skin to skin  Lactation Tools Discussed/Used Breast pump type: Other (comment)(personal pump)   Consult Status Consult Status: Follow-up Date: 11/09/19 Follow-up type: In-patient    Maryruth Hancock [facility] 11/08/2019, 10:54 PM

## 2019-11-08 NOTE — Progress Notes (Signed)
POSTPARTUM PROGRESS NOTE  Post Partum Day 1 Subjective:  Cassandra Thomas is a 29 y.o. [phone] [redacted]w[redacted]d s/p PLTCS 2/2 breech presentation.  No acute events overnight.  Pt denies problems with ambulating, voiding or po intake.  She denies nausea or vomiting.  Pain is moderately controlled.  She has had flatus. She has not had bowel movement.  Lochia Moderate.   Objective: Blood pressure 121/85, pulse (!) 59, temperature 97.7 F (36.5 C), temperature source Oral, resp. rate 18, height 5\' 9"  (1.753 m), weight 67.5 kg, SpO2 100 %, unknown if currently breastfeeding.  Physical Exam:  General: alert, cooperative and no distress Lochia:normal flow Chest: CTAB Heart: RRR no m/r/g Abdomen: +BS, soft, nontender,  Uterine Fundus: firm, below umbilicus DVT Evaluation: No calf swelling or tenderness Extremities: no edema  Recent Labs    11/07/19 1829 11/08/19 0520  HGB 12.5 11.4*  HCT 37.7 34.1*    Assessment/Plan:  ASSESSMENT: Cassandra Thomas is a 29 y.o. [phone] [redacted]w[redacted]d s/p primary LTCS. Recovering well so far.   Plan for discharge tomorrow and Breastfeeding  #Hgb 11.4 from 12.5. no further labs needed. Monitor bleeding. #seizure disorder: continue Keppra   LOS: 1 day   [redacted]w[redacted]d, MD 11/08/2019, 12:05 PM

## 2019-11-09 MED ORDER — GENERIC EXTERNAL MEDICATION
Status: DC
Start: ? — End: 2019-11-09

## 2019-11-09 NOTE — Lactation Note (Addendum)
This note was copied from a baby's chart. Lactation Consultation Note  Patient Name: Cassandra Thomas [id number] Date: 11/09/2019   Late-preterm infant is 49 hrs old. Mom had been using her own Avent pump; RN set her up w/the Symphony DEBP. Mom was using size 24 flanges; she needs size 21 flanges, which I provided. I also taught Mom a different hand expression technique which was more effective.   Dad reports that the yellow Similac slow-flow nipples may be too fast; infant gulps when bottle feeding. Bethann Humble, NP put in an order for an SLP feeding assessment. In the meantime, Denny Peon said I could provide the Nfant Slow Flow nipple, which I did.   I left a message with the SLP office & Jeb Levering, SLP returned my call. Anise Salvo is aware that an Nfant Slow Flow nipple is now at the bedside. Anise Salvo will contact Dickie La, RN.  I reviewed not trying for too long to get infant to latch (and to proceed with a bottle if it is evident that infant is not interested/competent in latching). I reminded parents that often LPIs need until around their EDD before they are competent at breastfeeding.   Mom's questions were answered to her satisfaction.    Mom is on Keppra 500mg  bid (L2).  [facility] 11/09/2019, 3:08 PM

## 2019-11-09 NOTE — Progress Notes (Addendum)
POSTPARTUM PROGRESS NOTE  Post Partum Day 2 Subjective:  Cassandra Thomas is a 29 y.o. [phone] [redacted]w[redacted]d s/p C-section.  No acute events overnight.  Pt reports problems ambulating due to pain but denies problems with voiding or po intake.  She denies nausea or vomiting.  Pain is a 4-5 out of 10 which becomes 8-9 while ambulating and is moderately controlled. Patient endorses large amount of vaginal bleeding, and needs to change pads every time she voids urine. Patient has not had flatus and has not had a bowel movement since the operation. Patient does not endorse changes in vision or dizziness and swelling is at baseline. Patient is having difficulty breastfeeding but is working with [employer]. Her goal is to breastfeed and supplement with formula. Patient does not desire contraception at this time.  Objective: Blood pressure 111/84, pulse 75, temperature 98.8 F (37.1 C), temperature source Oral, resp. rate 18, height 5\' 9"  (1.753 m), weight 67.5 kg, SpO2 97 %, unknown if currently breastfeeding.  Physical Exam:  General: alert, cooperative and no distress Lochia: heavy flow Chest: CTAB, no increased work of breathing, no pain on inspiration Heart: RRR no m/r/g Abdomen: +BS upon auscultation, soft, nontender, soreness near incision site Uterine Fundus: No concerns as per RN  DVT Evaluation: No calf swelling or tenderness, no SOB, no pleuritic pain Extremities: No pitting edema  Recent Labs    11/07/19 1829 11/08/19 0520  HGB 12.5 11.4*  HCT 37.7 34.1*    Assessment/Plan:  ASSESSMENT: Cassandra Thomas is a 29 y.o. [phone] [redacted]w[redacted]d s/p PLTCS who is hemodynamically stable and endorses good recovery except for a low Hgb of 11.4 and pain.  Plan is for discharge 11/09/19 or 11/10/19  #Hgb 12.5>11.4: Monitor bleeding #Pain: Continue Tylenol and PRN Oxycodone # H/x of Seizure Disorder: Continue Keppra   LOS: 2 days   11/12/19, Medical Student 11/09/2019, 7:39 AM      Attestation of Supervision of Student:  I confirm that I have verified the information documented in the medical student's note and that I have also personally reperformed the history, physical exam and all medical decision making activities.  I have verified that all services and findings are accurately documented in this student's note; and I agree with management and plan as outlined in the documentation. I have also made any necessary editorial changes.  --Discharge discussed. Plan to d/c home tomorrow unless newborn cleared by Peds for d/c today. RN aware and will call CNM for discharge orders PRN  11/11/2019, CNM [facility] 11/09/2019 10:35 AM

## 2019-11-10 MED ORDER — OXYCODONE HCL 5 MG PO TABS: 5.0000 mg | ORAL_TABLET | Freq: Four times a day (QID) | ORAL | 0 refills | Status: DC | PRN

## 2019-11-10 MED ORDER — IBUPROFEN 800 MG PO TABS: 800.0000 mg | ORAL_TABLET | Freq: Four times a day (QID) | ORAL | 0 refills | Status: DC

## 2019-11-10 NOTE — Lactation Note (Addendum)
This note was copied from a baby's chart. Lactation Consultation Note  Patient Name: Cassandra Thomas [id number] Date: 11/10/2019 Reason for consult: Follow-up assessment;Late-preterm 34-36.6wks;Infant < 6lbs   P1, Baby 61 hours old.  8.49% weight loss. < 6 lbs. [redacted]w[redacted]d. Discussed feeding on demand and waking baby if she has not fed in 3 hours. Feed on demand with cues.  Goal 8-12+ times per day after first 24 hrs.  Place baby STS if not cueing.  Reviewed volume guidelines for supplementation and increasing for goal 20-30 ml and later today increasing to 30 ml +. Mother states baby is latching better with #20NS but is sleepy at the breast. After 5-10 min she falls asleep which is WNL for LPI as explained. Encouraged mother to pump while FOB is giving supplement. Reminded mother to pump at least q 3 hours. Family has [employer].  Suggest calling their insurance for DEBP and offered gift shop rental as an option also. Reviewed engorgement care and monitoring voids/stools. Put in message for OP appointment.  Suggest family call LC to view next feeding. Mother is using #21 flanges to pump which she states are comfortable.     Maternal Data    Feeding Feeding Type: Breast Fed  LATCH Score                   Interventions Interventions: Breast feeding basics reviewed;DEBP  Lactation Tools Discussed/Used     Consult Status Consult Status: Complete Date: 11/10/19 Follow-up type: In-patient    [name] [facility] 11/10/2019, 9:13 AM

## 2019-11-10 NOTE — Discharge Instructions (Signed)
Cesarean Delivery, Care After This sheet gives you information about how to care for yourself after your procedure. Your health care provider may also give you more specific instructions. If you have problems or questions, contact your health care provider. What can I expect after the procedure? After the procedure, it is common to have:  A small amount of blood or clear fluid coming from the incision.  Some redness, swelling, and pain in your incision area.  Some abdominal pain and soreness.  Vaginal bleeding (lochia). Even though you did not have a vaginal delivery, you will still have vaginal bleeding and discharge.  Pelvic cramps.  Fatigue. You may have pain, swelling, and discomfort in the tissue between your vagina and your anus (perineum) if:  Your C-section was unplanned, and you were allowed to labor and push.  An incision was made in the area (episiotomy) or the tissue tore during attempted vaginal delivery. Follow these instructions at home: Incision care   Follow instructions from your health care provider about how to take care of your incision. Make sure you: ? Wash your hands with soap and water before you change your bandage (dressing). If soap and water are not available, use hand sanitizer. ? If you have a dressing, change it or remove it as told by your health care provider. ? Leave stitches (sutures), skin staples, skin glue, or adhesive strips in place. These skin closures may need to stay in place for 2 weeks or longer. If adhesive strip edges start to loosen and curl up, you may trim the loose edges. Do not remove adhesive strips completely unless your health care provider tells you to do that.  Check your incision area every day for signs of infection. Check for: ? More redness, swelling, or pain. ? More fluid or blood. ? Warmth. ? Pus or a bad smell.  Do not take baths, swim, or use a hot tub until your health care provider says it's okay. Ask your health  care provider if you can take showers.  When you cough or sneeze, hug a pillow. This helps with pain and decreases the chance of your incision opening up (dehiscing). Do this until your incision heals. Medicines  Take over-the-counter and prescription medicines only as told by your health care provider.  If you were prescribed an antibiotic medicine, take it as told by your health care provider. Do not stop taking the antibiotic even if you start to feel better.  Do not drive or use heavy machinery while taking prescription pain medicine. Lifestyle  Do not drink alcohol. This is especially important if you are breastfeeding or taking pain medicine.  Do not use any products that contain nicotine or tobacco, such as cigarettes, e-cigarettes, and chewing tobacco. If you need help quitting, ask your health care provider. Eating and drinking  Drink at least 8 eight-ounce glasses of water every day unless told not to by your health care provider. If you breastfeed, you may need to drink even more water.  Eat high-fiber foods every day. These foods may help prevent or relieve constipation. High-fiber foods include: ? Whole grain cereals and breads. ? Brown rice. ? Beans. ? Fresh fruits and vegetables. Activity   If possible, have someone help you care for your baby and help with household activities for at least a few days after you leave the hospital.  Return to your normal activities as told by your health care provider. Ask your health care provider what activities are safe for   you.  Rest as much as possible. Try to rest or take a nap while your baby is sleeping.  Do not lift anything that is heavier than 10 lbs (4.5 kg), or the limit that you were told, until your health care provider says that it is safe.  Talk with your health care provider about when you can engage in sexual activity. This may depend on your: ? Risk of infection. ? How fast you heal. ? Comfort and desire to  engage in sexual activity. General instructions  Do not use tampons or douches until your health care provider approves.  Wear loose, comfortable clothing and a supportive and well-fitting bra.  Keep your perineum clean and dry. Wipe from front to back when you use the toilet.  If you pass a blood clot, save it and call your health care provider to discuss. Do not flush blood clots down the toilet before you get instructions from your health care provider.  Keep all follow-up visits for you and your baby as told by your health care provider. This is important. Contact a health care provider if:  You have: ? A fever. ? Bad-smelling vaginal discharge. ? Pus or a bad smell coming from your incision. ? Difficulty or pain when urinating. ? A sudden increase or decrease in the frequency of your bowel movements. ? More redness, swelling, or pain around your incision. ? More fluid or blood coming from your incision. ? A rash. ? Nausea. ? Little or no interest in activities you used to enjoy. ? Questions about caring for yourself or your baby.  Your incision feels warm to the touch.  Your breasts turn red or become painful or hard.  You feel unusually sad or worried.  You vomit.  You pass a blood clot from your vagina.  You urinate more than usual.  You are dizzy or light-headed. Get help right away if:  You have: ? Pain that does not go away or get better with medicine. ? Chest pain. ? Difficulty breathing. ? Blurred vision or spots in your vision. ? Thoughts about hurting yourself or your baby. ? New pain in your abdomen or in one of your legs. ? A severe headache.  You faint.  You bleed from your vagina so much that you fill more than one sanitary pad in one hour. Bleeding should not be heavier than your heaviest period. Summary  After the procedure, it is common to have pain at your incision site, abdominal cramping, and slight bleeding from your vagina.  Check  your incision area every day for signs of infection.  Tell your health care provider about any unusual symptoms.  Keep all follow-up visits for you and your baby as told by your health care provider. This information is not intended to replace advice given to you by your health care provider. Make sure you discuss any questions you have with your health care provider. Document Revised: 12/18/2017 Document Reviewed: 12/18/2017 Elsevier Patient Education  2020 [employer]vier Inc.  

## 2019-11-17 ENCOUNTER — Telehealth: Payer: Self-pay | Admitting: Radiology

## 2019-11-17 NOTE — Telephone Encounter (Signed)
Patient called wanting to schedule postpartum appointment, I explained that she already had 2 appointments scheduled and she stated that she was not informed of appointment date and times. Confirmed appointment Date/time and gave address for [facility]

## 2019-11-20 ENCOUNTER — Other Ambulatory Visit: Payer: Self-pay

## 2019-11-20 ENCOUNTER — Ambulatory Visit (INDEPENDENT_AMBULATORY_CARE_PROVIDER_SITE_OTHER): Payer: 59 | Admitting: *Deleted

## 2019-11-20 VITALS — BP 117/86 | HR 99 | Temp 98.5°F | Ht 70.0 in | Wt 129.3 lb

## 2019-11-20 DIAGNOSIS — Z4889 Encounter for other specified surgical aftercare: Secondary | ICD-10-CM

## 2019-11-20 NOTE — Progress Notes (Signed)
Pt here for incision check following C/S on 5/15. Incision assessed and was found to be well healed and without redness, swelling or drainage. Skin edges well approximated. Pt was advised of proper hygiene of the incision. PP appt is scheduled on 6/17. She voiced understanding and had no questions.

## 2019-11-20 NOTE — Progress Notes (Signed)
Patient seen and assessed by nursing staff.  Agree with documentation and plan.  

## 2019-11-30 NOTE — Telephone Encounter (Signed)
Error

## 2019-12-10 ENCOUNTER — Encounter: Payer: Self-pay | Admitting: Obstetrics and Gynecology

## 2019-12-10 ENCOUNTER — Telehealth (INDEPENDENT_AMBULATORY_CARE_PROVIDER_SITE_OTHER): Payer: 59 | Admitting: Obstetrics and Gynecology

## 2019-12-10 NOTE — Progress Notes (Signed)
I connected with@ on 12/10/19 at 10:55 AM EDT by: Mychart video and verified that I am speaking with the correct person using two identifiers.  Patient is located at home and provider is located at [facility] for [employer] [facility] [employer] [facility] .     The purpose of this virtual visit is to provide medical care while limiting exposure to the novel coronavirus. I discussed the limitations, risks, security and privacy concerns of performing an evaluation and management service by virtual visit and the availability of in person appointments. I also discussed with the patient that there may be a patient responsible charge related to this service. By engaging in this virtual visit, you consent to the provision of healthcare.  Additionally, you authorize for your insurance to be billed for the services provided during this visit.  The patient expressed understanding and agreed to proceed.  The following staff members participated in the virtual visit:  Venia Carbon, NP, Pam CNM  Post Partum Visit Note Subjective:   Cassandra Thomas is a 29 y.o. [phone] female being evaluated for postpartum followup.  She is 4 weeks postpartum following a primary cesarean section at  36/4 gestational weeks.  I have fully reviewed the prenatal and intrapartum course; pregnancy complicated by Breech presentation, preterm delivery.  Postpartum course has been uncomplicated.  Baby is doing well. Baby is feeding by both breast and bottle - [employer] Good Start. Bleeding staining only. Bowel function is normal. Bladder function is normal. Patient is not sexually active. Contraception method is none. Postpartum depression screening: negative.  The following portions of the patient's history were reviewed and updated as appropriate: allergies, current medications, past family history, past medical history, past social history, past surgical history and problem list.  Review of Systems Pertinent items are noted in  HPI.   Objective:  There were no vitals filed for this visit. Self-Obtained       Assessment:   Normal postpartum exam. Does not have a BP cuff. Never had elevated BP in pregnancy or PP  Plan:  Essential components of care per ACOG recommendations:   1.  Mood and well being: Patient with negative depression screening today. Reviewed local resources for support.  - Patient does use tobacco. If using tobacco we discussed reduction and for recently cessation risk of relapse. She is vaping.  - hx of drug use? Yes, marijuana use during pregnancy. None now.   2. Infant care and feeding:  -Patient currently breastmilk feeding? Yes If breastmilk feeding discussed return to work and pumping. If needed, patient was provided letter for work to allow for every 2-3 hr pumping breaks, and to be granted a private location to express breastmilk and refrigerated area to store breastmilk. Reviewed importance of draining breast regularly to support lactation. -Social determinants of health (SDOH) reviewed in EPIC.  3. Sexuality, contraception and birth spacing - Patient does not want a pregnancy in the next year.  Desired family size is 2 children.  - Reviewed forms of contraception in tiered fashion. Patient desired no method today.   - Discussed birth spacing of 18 months  4. Sleep and fatigue -Encouraged family/partner/community support of 4 hrs of uninterrupted sleep to help with mood and fatigue  5. Physical Recovery  - Discussed patients delivery and complications - Patient has urinary incontinence?  - Patient is safe to resume physical and sexual activity  6.  Health Maintenance - Last pap smear done 2021 and was ?abnormal, states she needed  a repeat pap PP    7. No Chronic Disease - PCP follow up  10 minutes of non-face-to-face time spent with the patient    Noni Saupe, NP [facility], [place]

## 2020-04-07 ENCOUNTER — Other Ambulatory Visit: Payer: Self-pay

## 2020-04-07 ENCOUNTER — Ambulatory Visit (INDEPENDENT_AMBULATORY_CARE_PROVIDER_SITE_OTHER): Payer: 59

## 2020-04-07 DIAGNOSIS — Z3200 Encounter for pregnancy test, result unknown: Secondary | ICD-10-CM

## 2020-04-07 DIAGNOSIS — Z3201 Encounter for pregnancy test, result positive: Secondary | ICD-10-CM | POA: Diagnosis not present

## 2020-04-07 NOTE — Progress Notes (Signed)
Pt left urine sample in office today for UPT; UPT is positive. Called pt with positive result. First home UPT on 04/06/20. Dating reviewed with pt. Based on LMP of 02/25/20, pt is 6w today; EDD is 12/01/20. Pt requests to schedule prenatal appt with our office. Front office notified to schedule. Reviewed pt's medications and allergies. Pt denies any bleeding or pain at this time and has no other concerns. Pt will follow up with office as needed prior to new ob appts.  Fleet Contras RN 04/07/20

## 2020-04-08 LAB — POCT PREGNANCY, URINE: Preg Test, Ur: POSITIVE — AB

## 2020-04-08 NOTE — Progress Notes (Signed)
Chart reviewed for nurse visit. Agree with plan of care.   Jawana Reagor Lorraine, CNM 04/08/2020 10:11 AM   

## 2020-06-03 ENCOUNTER — Other Ambulatory Visit: Payer: Self-pay

## 2020-06-03 ENCOUNTER — Ambulatory Visit (INDEPENDENT_AMBULATORY_CARE_PROVIDER_SITE_OTHER): Payer: 59 | Admitting: Family Medicine

## 2020-06-03 ENCOUNTER — Encounter: Payer: Self-pay | Admitting: Family Medicine

## 2020-06-03 ENCOUNTER — Other Ambulatory Visit (HOSPITAL_COMMUNITY)
Admission: RE | Admit: 2020-06-03 | Discharge: 2020-06-03 | Disposition: A | Discharge status: Home / Self Care | Payer: 59 | Source: Ambulatory Visit | Attending: Family Medicine | Admitting: Family Medicine

## 2020-06-03 VITALS — BP 125/87 | HR 85 | Wt 111.5 lb

## 2020-06-03 DIAGNOSIS — O09899 Supervision of other high risk pregnancies, unspecified trimester: Secondary | ICD-10-CM

## 2020-06-03 DIAGNOSIS — Z720 Tobacco use: Secondary | ICD-10-CM

## 2020-06-03 DIAGNOSIS — R8271 Bacteriuria: Secondary | ICD-10-CM

## 2020-06-03 DIAGNOSIS — O99891 Other specified diseases and conditions complicating pregnancy: Secondary | ICD-10-CM

## 2020-06-03 DIAGNOSIS — O0990 Supervision of high risk pregnancy, unspecified, unspecified trimester: Secondary | ICD-10-CM | POA: Insufficient documentation

## 2020-06-03 DIAGNOSIS — Z23 Encounter for immunization: Secondary | ICD-10-CM | POA: Diagnosis not present

## 2020-06-03 DIAGNOSIS — Z98891 History of uterine scar from previous surgery: Secondary | ICD-10-CM | POA: Insufficient documentation

## 2020-06-03 DIAGNOSIS — Z8719 Personal history of other diseases of the digestive system: Secondary | ICD-10-CM

## 2020-06-03 DIAGNOSIS — G40909 Epilepsy, unspecified, not intractable, without status epilepticus: Secondary | ICD-10-CM

## 2020-06-03 LAB — POCT URINALYSIS DIP (DEVICE)
Bilirubin Urine: NEGATIVE
Glucose, UA: NEGATIVE mg/dL
Hgb urine dipstick: NEGATIVE
Ketones, ur: NEGATIVE mg/dL
Leukocytes,Ua: NEGATIVE
Nitrite: NEGATIVE
Protein, ur: NEGATIVE mg/dL
Specific Gravity, Urine: >=1.03 (ref 1.005–1.030)
Urobilinogen, UA: 0.2 mg/dL (ref 0.0–1.0)
pH: 7 (ref 5.0–8.0)

## 2020-06-03 NOTE — Addendum Note (Signed)
Addended by: Maxwell Marion E on: 06/03/2020 12:08 PM   Modules accepted: Orders

## 2020-06-03 NOTE — Progress Notes (Signed)
   Subjective:  Cassandra Thomas is a 29 y.o. [phone] at [redacted]w[redacted]d being seen today for ongoing prenatal care.  She is currently monitored for the following issues for this high-risk pregnancy and has Seizure disorder (HCC); Supervision of high risk pregnancy, antepartum; History of acute pancreatitis; Current nicotine use; and Short interval between pregnancies affecting pregnancy, antepartum on their problem list.  Patient reports no complaints.  Contractions: Not present. Vag. Bleeding: None.  Movement: Absent. Denies leaking of fluid.   The following portions of the patient's history were reviewed and updated as appropriate: allergies, current medications, past family history, past medical history, past social history, past surgical history and problem list. Problem list updated.  Objective:   Vitals:   06/03/20 0936  BP: 125/87  Pulse: 85  Weight: 111 lb 8 oz (50.6 kg)    Fetal Status: Fetal Heart Rate (bpm): 167   Movement: Absent     General:  Alert, oriented and cooperative. Patient is in no acute distress.  Skin: Skin is warm and dry. No rash noted.   Cardiovascular: Normal heart rate noted  Respiratory: Normal respiratory effort, no problems with respiration noted  Abdomen: Soft, gravid, appropriate for gestational age. Pain/Pressure: Absent     Pelvic: Vag. Bleeding: None     Cervical exam deferred        Extremities: Normal range of motion.  Edema: None  Mental Status: Normal mood and affect. Normal behavior. Normal judgment and thought content.   Urinalysis:      Assessment and Plan:  Pregnancy: [id number] at [redacted]w[redacted]d  1. Supervision of high risk pregnancy, antepartum Reviewed structure of Faculty practice with patient, that they may not always see the same provider, and that physicians, fellows, CNM, NP, PA, residents, and medical students may all be involved in their care. Discussed genetic testing, after counseling she declines Preterm labor and delivery at [redacted]w[redacted]d, after  counseling declines progesterone therapy Pap up to date Routine initial OB labs collected Scheduled for anatomy scan Considering BTL vs partner may want vasectomy  2. Seizure disorder (HCC) Reports seizures related to EtOH withdrawal in the past, alcohol use disorder currently in remission and no seizures for 4 years Not currently taking Keppra since end of last pregnancy Given no hx of primary seizure disorder OK to monitor clinically off Keppra  3. Short interval between pregnancies affecting pregnancy, antepartum pLTCS 10/2019 for PTL and malpresentation Reviewed increased risks of PTL, etc.   4. History of cesarean delivery PTL and breech presentation  Undecided between TOLAC and rLTCS, discuss further at next visit  5. Current nicotine use Vaping but tapering off Encouraged in her efforts, discussed increased risk of NAS with tobacco products  Preterm labor symptoms and general obstetric precautions including but not limited to vaginal bleeding, contractions, leaking of fluid and fetal movement were reviewed in detail with the patient. Please refer to After Visit Summary for other counseling recommendations.  Return in about 4 weeks (around 07/01/2020) for [facility], ob visit.   Venora Maples, MD

## 2020-06-03 NOTE — Patient Instructions (Signed)
Breastfeeding  Choosing to breastfeed is one of the best decisions you can make for yourself and your baby. A change in hormones during pregnancy causes your breasts to make breast milk in your milk-producing glands. Hormones prevent breast milk from being released before your baby is born. They also prompt milk flow after birth. Once breastfeeding has begun, thoughts of your baby, as well as his or her sucking or crying, can stimulate the release of milk from your milk-producing glands. Benefits of breastfeeding Research shows that breastfeeding offers many health benefits for infants and mothers. It also offers a cost-free and convenient way to feed your baby. For your baby  Your first milk (colostrum) helps your baby's digestive system to function better.  Special cells in your milk (antibodies) help your baby to fight off infections.  Breastfed babies are less likely to develop asthma, allergies, obesity, or type 2 diabetes. They are also at lower risk for sudden infant death syndrome (SIDS).  Nutrients in breast milk are better able to meet your baby's needs compared to infant formula.  Breast milk improves your baby's brain development. For you  Breastfeeding helps to create a very special bond between you and your baby.  Breastfeeding is convenient. Breast milk costs nothing and is always available at the correct temperature.  Breastfeeding helps to burn calories. It helps you to lose the weight that you gained during pregnancy.  Breastfeeding makes your uterus return faster to its size before pregnancy. It also slows bleeding (lochia) after you give birth.  Breastfeeding helps to lower your risk of developing type 2 diabetes, osteoporosis, rheumatoid arthritis, cardiovascular disease, and breast, ovarian, uterine, and endometrial cancer later in life. Breastfeeding basics Starting breastfeeding  Find a comfortable place to sit or lie down, with your neck and back  well-supported.  Place a pillow or a rolled-up blanket under your baby to bring him or her to the level of your breast (if you are seated). Nursing pillows are specially designed to help support your arms and your baby while you breastfeed.  Make sure that your baby's tummy (abdomen) is facing your abdomen.  Gently massage your breast. With your fingertips, massage from the outer edges of your breast inward toward the nipple. This encourages milk flow. If your milk flows slowly, you may need to continue this action during the feeding.  Support your breast with 4 fingers underneath and your thumb above your nipple (make the letter "C" with your hand). Make sure your fingers are well away from your nipple and your baby's mouth.  Stroke your baby's lips gently with your finger or nipple.  When your baby's mouth is open wide enough, quickly bring your baby to your breast, placing your entire nipple and as much of the areola as possible into your baby's mouth. The areola is the colored area around your nipple. ? More areola should be visible above your baby's upper lip than below the lower lip. ? Your baby's lips should be opened and extended outward (flanged) to ensure an adequate, comfortable latch. ? Your baby's tongue should be between his or her lower gum and your breast.  Make sure that your baby's mouth is correctly positioned around your nipple (latched). Your baby's lips should create a seal on your breast and be turned out (everted).  It is common for your baby to suck about 2-3 minutes in order to start the flow of breast milk. Latching Teaching your baby how to latch onto your breast properly is  very important. An improper latch can cause nipple pain, decreased milk supply, and poor weight gain in your baby. Also, if your baby is not latched onto your nipple properly, he or she may swallow some air during feeding. This can make your baby fussy. Burping your baby when you switch breasts  during the feeding can help to get rid of the air. However, teaching your baby to latch on properly is still the best way to prevent fussiness from swallowing air while breastfeeding. Signs that your baby has successfully latched onto your nipple  Silent tugging or silent sucking, without causing you pain. Infant's lips should be extended outward (flanged).  Swallowing heard between every 3-4 sucks once your milk has started to flow (after your let-down milk reflex occurs).  Muscle movement above and in front of his or her ears while sucking. Signs that your baby has not successfully latched onto your nipple  Sucking sounds or smacking sounds from your baby while breastfeeding.  Nipple pain. If you think your baby has not latched on correctly, slip your finger into the corner of your baby's mouth to break the suction and place it between your baby's gums. Attempt to start breastfeeding again. Signs of successful breastfeeding Signs from your baby  Your baby will gradually decrease the number of sucks or will completely stop sucking.  Your baby will fall asleep.  Your baby's body will relax.  Your baby will retain a small amount of milk in his or her mouth.  Your baby will let go of your breast by himself or herself. Signs from you  Breasts that have increased in firmness, weight, and size 1-3 hours after feeding.  Breasts that are softer immediately after breastfeeding.  Increased milk volume, as well as a change in milk consistency and color by the fifth day of breastfeeding.  Nipples that are not sore, cracked, or bleeding. Signs that your baby is getting enough milk  Wetting at least 1-2 diapers during the first 24 hours after birth.  Wetting at least 5-6 diapers every 24 hours for the first week after birth. The urine should be clear or pale yellow by the age of 5 days.  Wetting 6-8 diapers every 24 hours as your baby continues to grow and develop.  At least 3 stools in  a 24-hour period by the age of 5 days. The stool should be soft and yellow.  At least 3 stools in a 24-hour period by the age of 7 days. The stool should be seedy and yellow.  No loss of weight greater than 10% of birth weight during the first 3 days of life.  Average weight gain of 4-7 oz (113-198 g) per week after the age of 4 days.  Consistent daily weight gain by the age of 5 days, without weight loss after the age of 2 weeks. After a feeding, your baby may spit up a small amount of milk. This is normal. Breastfeeding frequency and duration Frequent feeding will help you make more milk and can prevent sore nipples and extremely full breasts (breast engorgement). Breastfeed when you feel the need to reduce the fullness of your breasts or when your baby shows signs of hunger. This is called "breastfeeding on demand." Signs that your baby is hungry include:  Increased alertness, activity, or restlessness.  Movement of the head from side to side.  Opening of the mouth when the corner of the mouth or cheek is stroked (rooting).  Increased sucking sounds, smacking lips, cooing,   sighing, or squeaking.  Hand-to-mouth movements and sucking on fingers or hands.  Fussing or crying. Avoid introducing a pacifier to your baby in the first 4-6 weeks after your baby is born. After this time, you may choose to use a pacifier. Research has shown that pacifier use during the first year of a baby's life decreases the risk of sudden infant death syndrome (SIDS). Allow your baby to feed on each breast as long as he or she wants. When your baby unlatches or falls asleep while feeding from the first breast, offer the second breast. Because newborns are often sleepy in the first few weeks of life, you may need to awaken your baby to get him or her to feed. Breastfeeding times will vary from baby to baby. However, the following rules can serve as a guide to help you make sure that your baby is properly  fed:  Newborns (babies 41 weeks of age or younger) may breastfeed every 1-3 hours.  Newborns should not go without breastfeeding for longer than 3 hours during the day or 5 hours during the night.  You should breastfeed your baby a minimum of 8 times in a 24-hour period. Breast milk pumping     Pumping and storing breast milk allows you to make sure that your baby is exclusively fed your breast milk, even at times when you are unable to breastfeed. This is especially important if you go back to work while you are still breastfeeding, or if you are not able to be present during feedings. Your lactation consultant can help you find a method of pumping that works best for you and give you guidelines about how long it is safe to store breast milk. Caring for your breasts while you breastfeed Nipples can become dry, cracked, and sore while breastfeeding. The following recommendations can help keep your breasts moisturized and healthy:  Avoid using soap on your nipples.  Wear a supportive bra designed especially for nursing. Avoid wearing underwire-style bras or extremely tight bras (sports bras).  Air-dry your nipples for 3-4 minutes after each feeding.  Use only cotton bra pads to absorb leaked breast milk. Leaking of breast milk between feedings is normal.  Use lanolin on your nipples after breastfeeding. Lanolin helps to maintain your skin's normal moisture barrier. Pure lanolin is not harmful (not toxic) to your baby. You may also hand express a few drops of breast milk and gently massage that milk into your nipples and allow the milk to air-dry. In the first few weeks after giving birth, some women experience breast engorgement. Engorgement can make your breasts feel heavy, warm, and tender to the touch. Engorgement peaks within 3-5 days after you give birth. The following recommendations can help to ease engorgement:  Completely empty your breasts while breastfeeding or pumping. You may  want to start by applying warm, moist heat (in the shower or with warm, water-soaked hand towels) just before feeding or pumping. This increases circulation and helps the milk flow. If your baby does not completely empty your breasts while breastfeeding, pump any extra milk after he or she is finished.  Apply ice packs to your breasts immediately after breastfeeding or pumping, unless this is too uncomfortable for you. To do this: ? Put ice in a plastic bag. ? Place a towel between your skin and the bag. ? Leave the ice on for 20 minutes, 2-3 times a day.  Make sure that your baby is latched on and positioned properly while breastfeeding. If  engorgement persists after 48 hours of following these recommendations, contact your health care provider or a [employer]. Overall health care recommendations while breastfeeding  Eat 3 healthy meals and 3 snacks every day. Well-nourished mothers who are breastfeeding need an additional 450-500 calories a day. You can meet this requirement by increasing the amount of a balanced diet that you eat.  Drink enough water to keep your urine pale yellow or clear.  Rest often, relax, and continue to take your prenatal vitamins to prevent fatigue, stress, and low vitamin and mineral levels in your body (nutrient deficiencies).  Do not use any products that contain nicotine or tobacco, such as cigarettes and e-cigarettes. Your baby may be harmed by chemicals from cigarettes that pass into breast milk and exposure to secondhand smoke. If you need help quitting, ask your health care provider.  Avoid alcohol.  Do not use illegal drugs or marijuana.  Talk with your health care provider before taking any medicines. These include over-the-counter and prescription medicines as well as vitamins and herbal supplements. Some medicines that may be harmful to your baby can pass through breast milk.  It is possible to become pregnant while breastfeeding. If birth  control is desired, ask your health care provider about options that will be safe while breastfeeding your baby. Where to find more information: [employer]: www.llli.org Contact a health care provider if:  You feel like you want to stop breastfeeding or have become frustrated with breastfeeding.  Your nipples are cracked or bleeding.  Your breasts are red, tender, or warm.  You have: ? Painful breasts or nipples. ? A swollen area on either breast. ? A fever or chills. ? Nausea or vomiting. ? Drainage other than breast milk from your nipples.  Your breasts do not become full before feedings by the fifth day after you give birth.  You feel sad and depressed.  Your baby is: ? Too sleepy to eat well. ? Having trouble sleeping. ? More than 40 week old and wetting fewer than 6 diapers in a 24-hour period. ? Not gaining weight by 38 days of age.  Your baby has fewer than 3 stools in a 24-hour period.  Your baby's skin or the white parts of his or her eyes become yellow. Get help right away if:  Your baby is overly tired (lethargic) and does not want to wake up and feed.  Your baby develops an unexplained fever. Summary  Breastfeeding offers many health benefits for infant and mothers.  Try to breastfeed your infant when he or she shows early signs of hunger.  Gently tickle or stroke your baby's lips with your finger or nipple to allow the baby to open his or her mouth. Bring the baby to your breast. Make sure that much of the areola is in your baby's mouth. Offer one side and burp the baby before you offer the other side.  Talk with your health care provider or lactation consultant if you have questions or you face problems as you breastfeed. This information is not intended to replace advice given to you by your health care provider. Make sure you discuss any questions you have with your health care provider. Document Revised: 09/05/2017 Document Reviewed:  07/13/2016 Elsevier Patient Education  2020 [employer].   Contraception Choices Contraception, also called birth control, means things to use or ways to try not to get pregnant. Hormonal birth control This kind of birth control uses hormones. Here are some types of  hormonal birth control:  A tube that is put under skin of the arm (implant). The tube can stay in for as long as 3 years.  Shots to get every 3 months (injections).  Pills to take every day (birth control pills).  A patch to change 1 time each week for 3 weeks (birth control patch). After that, the patch is taken off for 1 week.  A ring to put in the vagina. The ring is left in for 3 weeks. Then it is taken out of the vagina for 1 week. Then a new ring is put in.  Pills to take after unprotected sex (emergency birth control pills). Barrier birth control Here are some types of barrier birth control:  A thin covering that is put on the penis before sex (female condom). The covering is thrown away after sex.  A soft, loose covering that is put in the vagina before sex (female condom). The covering is thrown away after sex.  A rubber bowl that sits over the cervix (diaphragm). The bowl must be made for you. The bowl is put into the vagina before sex. The bowl is left in for 6-8 hours after sex. It is taken out within 24 hours.  A small, soft cup that fits over the cervix (cervical cap). The cup must be made for you. The cup can be left in for 6-8 hours after sex. It is taken out within 48 hours.  A sponge that is put into the vagina before sex. It must be left in for at least 6 hours after sex. It must be taken out within 30 hours. Then it is thrown away.  A chemical that kills or stops sperm from getting into the uterus (spermicide). It may be a pill, cream, jelly, or foam to put in the vagina. The chemical should be used at least 10-15 minutes before sex. IUD (intrauterine) birth control An IUD is a small, T-shaped piece  of plastic. It is put inside the uterus. There are two kinds:  Hormone IUD. This kind can stay in for 3-5 years.  Copper IUD. This kind can stay in for 10 years. Permanent birth control Here are some types of permanent birth control:  Surgery to block the fallopian tubes.  Having an insert put into each fallopian tube.  Surgery to tie off the tubes that carry sperm (vasectomy). Natural planning birth control Here are some types of natural planning birth control:  Not having sex on the days the woman could get pregnant.  Using a calendar: ? To keep track of the length of each period. ? To find out what days pregnancy can happen. ? To plan to not have sex on days when pregnancy can happen.  Watching for symptoms of ovulation and not having sex during ovulation. One way the woman can check for ovulation is to check her temperature.  Waiting to have sex until after ovulation. Summary  Contraception, also called birth control, means things to use or ways to try not to get pregnant.  Hormonal methods of birth control include implants, injections, pills, patches, vaginal rings, and emergency birth control pills.  Barrier methods of birth control can include female condoms, female condoms, diaphragms, cervical caps, sponges, and spermicides.  There are two types of IUD (intrauterine device) birth control. An IUD can be put in a woman's uterus to prevent pregnancy for 3-5 years.  Permanent sterilization can be done through a procedure for males, females, or both.  Natural planning methods involve  not having sex on the days when the woman could get pregnant. This information is not intended to replace advice given to you by your health care provider. Make sure you discuss any questions you have with your health care provider. Document Revised: 10/01/2018 Document Reviewed: 06/21/2016 Elsevier Patient Education  2020 [employer].   Second Trimester of Pregnancy  The second  trimester is from week 14 through week 27 (month 4 through 6). This is often the time in pregnancy that you feel your best. Often times, morning sickness has lessened or quit. You may have more energy, and you may get hungry more often. Your unborn baby is growing rapidly. At the end of the sixth month, he or she is about 9 inches long and weighs about 1 pounds. You will likely feel the baby move between 18 and 20 weeks of pregnancy. Follow these instructions at home: Medicines  Take over-the-counter and prescription medicines only as told by your doctor. Some medicines are safe and some medicines are not safe during pregnancy.  Take a prenatal vitamin that contains at least 600 micrograms (mcg) of folic acid.  If you have trouble pooping (constipation), take medicine that will make your stool soft (stool softener) if your doctor approves. Eating and drinking   Eat regular, healthy meals.  Avoid raw meat and uncooked cheese.  If you get low calcium from the food you eat, talk to your doctor about taking a daily calcium supplement.  Avoid foods that are high in fat and sugars, such as fried and sweet foods.  If you feel sick to your stomach (nauseous) or throw up (vomit): ? Eat 4 or 5 small meals a day instead of 3 large meals. ? Try eating a few soda crackers. ? Drink liquids between meals instead of during meals.  To prevent constipation: ? Eat foods that are high in fiber, like fresh fruits and vegetables, whole grains, and beans. ? Drink enough fluids to keep your pee (urine) clear or pale yellow. Activity  Exercise only as told by your doctor. Stop exercising if you start to have cramps.  Do not exercise if it is too hot, too humid, or if you are in a place of great height (high altitude).  Avoid heavy lifting.  Wear low-heeled shoes. Sit and stand up straight.  You can continue to have sex unless your doctor tells you not to. Relieving pain and discomfort  Wear a good  support bra if your breasts are tender.  Take warm water baths (sitz baths) to soothe pain or discomfort caused by hemorrhoids. Use hemorrhoid cream if your doctor approves.  Rest with your legs raised if you have leg cramps or low back pain.  If you develop puffy, bulging veins (varicose veins) in your legs: ? Wear support hose or compression stockings as told by your doctor. ? Raise (elevate) your feet for 15 minutes, 3-4 times a day. ? Limit salt in your food. Prenatal care  Write down your questions. Take them to your prenatal visits.  Keep all your prenatal visits as told by your doctor. This is important. Safety  Wear your seat belt when driving.  Make a list of emergency phone numbers, including numbers for family, friends, the hospital, and police and fire departments. General instructions  Ask your doctor about the right foods to eat or for help finding a counselor, if you need these services.  Ask your doctor about local prenatal classes. Begin classes before month 6 of your pregnancy.  Do not use hot tubs, steam rooms, or saunas.  Do not douche or use tampons or scented sanitary pads.  Do not cross your legs for long periods of time.  Visit your dentist if you have not done so. Use a soft toothbrush to brush your teeth. Floss gently.  Avoid all smoking, herbs, and alcohol. Avoid drugs that are not approved by your doctor.  Do not use any products that contain nicotine or tobacco, such as cigarettes and e-cigarettes. If you need help quitting, ask your doctor.  Avoid cat litter boxes and soil used by cats. These carry germs that can cause birth defects in the baby and can cause a loss of your baby (miscarriage) or stillbirth. Contact a doctor if:  You have mild cramps or pressure in your lower belly.  You have pain when you pee (urinate).  You have bad smelling fluid coming from your vagina.  You continue to feel sick to your stomach (nauseous), throw up  (vomit), or have watery poop (diarrhea).  You have a nagging pain in your belly area.  You feel dizzy. Get help right away if:  You have a fever.  You are leaking fluid from your vagina.  You have spotting or bleeding from your vagina.  You have severe belly cramping or pain.  You lose or gain weight rapidly.  You have trouble catching your breath and have chest pain.  You notice sudden or extreme puffiness (swelling) of your face, hands, ankles, feet, or legs.  You have not felt the baby move in over an hour.  You have severe headaches that do not go away when you take medicine.  You have trouble seeing. Summary  The second trimester is from week 14 through week 27 (months 4 through 6). This is often the time in pregnancy that you feel your best.  To take care of yourself and your unborn baby, you will need to eat healthy meals, take medicines only if your doctor tells you to do so, and do activities that are safe for you and your baby.  Call your doctor if you get sick or if you notice anything unusual about your pregnancy. Also, call your doctor if you need help with the right food to eat, or if you want to know what activities are safe for you. This information is not intended to replace advice given to you by your health care provider. Make sure you discuss any questions you have with your health care provider. Document Revised: 10/03/2018 Document Reviewed: 07/17/2016 Elsevier Patient Education  2020 [employer].

## 2020-06-04 LAB — CBC/D/PLT+RPR+RH+ABO+RUB AB...
Antibody Screen: NEGATIVE
Basophils Absolute: 0 10*3/uL (ref 0.0–0.2)
Basos: 0 %
EOS (ABSOLUTE): 0.1 10*3/uL (ref 0.0–0.4)
Eos: 1 %
HCV Ab: <0.1 s/co ratio (ref 0.0–0.9)
HIV Screen 4th Generation wRfx: NONREACTIVE
Hematocrit: 41.4 % (ref 34.0–46.6)
Hemoglobin: 14.2 g/dL (ref 11.1–15.9)
Hepatitis B Surface Ag: NEGATIVE
Immature Grans (Abs): 0 10*3/uL (ref 0.0–0.1)
Immature Granulocytes: 0 %
Lymphocytes Absolute: 1.7 10*3/uL (ref 0.7–3.1)
Lymphs: 17 %
MCH: 30.3 pg (ref 26.6–33.0)
MCHC: 34.3 g/dL (ref 31.5–35.7)
MCV: 89 fL (ref 79–97)
Monocytes Absolute: 0.6 10*3/uL (ref 0.1–0.9)
Monocytes: 6 %
Neutrophils Absolute: 8 10*3/uL — ABNORMAL HIGH (ref 1.4–7.0)
Neutrophils: 76 %
Platelets: 177 10*3/uL (ref 150–450)
RBC: 4.68 x10E6/uL (ref 3.77–5.28)
RDW: 13.1 % (ref 11.7–15.4)
RPR Ser Ql: NONREACTIVE
Rh Factor: POSITIVE
Rubella Antibodies, IGG: 1.45 index (ref >0.99)
WBC: 10.5 10*3/uL (ref 3.4–10.8)

## 2020-06-04 LAB — HCV INTERPRETATION

## 2020-06-06 DIAGNOSIS — O99891 Other specified diseases and conditions complicating pregnancy: Secondary | ICD-10-CM | POA: Insufficient documentation

## 2020-06-06 LAB — URINE CULTURE, OB REFLEX

## 2020-06-06 LAB — GC/CHLAMYDIA PROBE AMP (~~LOC~~) NOT AT ARMC
Chlamydia: NEGATIVE
Comment: NEGATIVE
Comment: NORMAL
Neisseria Gonorrhea: NEGATIVE

## 2020-06-06 LAB — CULTURE, OB URINE

## 2020-06-06 MED ORDER — AMOXICILLIN-POT CLAVULANATE 875-125 MG PO TABS: 1.0000 | ORAL_TABLET | Freq: Two times a day (BID) | ORAL | 0 refills | Status: AC

## 2020-06-06 NOTE — Addendum Note (Signed)
Addended by: Merian Capron on: 06/06/2020 02:10 PM   Modules accepted: Orders

## 2020-07-01 ENCOUNTER — Encounter: Payer: Self-pay | Admitting: Obstetrics and Gynecology

## 2020-07-01 ENCOUNTER — Other Ambulatory Visit: Payer: Self-pay

## 2020-07-01 ENCOUNTER — Ambulatory Visit (INDEPENDENT_AMBULATORY_CARE_PROVIDER_SITE_OTHER): Payer: 59 | Admitting: Obstetrics and Gynecology

## 2020-07-01 VITALS — BP 133/71 | HR 84 | Wt 114.2 lb

## 2020-07-01 DIAGNOSIS — R8271 Bacteriuria: Secondary | ICD-10-CM

## 2020-07-01 DIAGNOSIS — O0990 Supervision of high risk pregnancy, unspecified, unspecified trimester: Secondary | ICD-10-CM

## 2020-07-01 DIAGNOSIS — O09899 Supervision of other high risk pregnancies, unspecified trimester: Secondary | ICD-10-CM

## 2020-07-01 DIAGNOSIS — Z98891 History of uterine scar from previous surgery: Secondary | ICD-10-CM

## 2020-07-01 DIAGNOSIS — O99891 Other specified diseases and conditions complicating pregnancy: Secondary | ICD-10-CM

## 2020-07-01 DIAGNOSIS — Z720 Tobacco use: Secondary | ICD-10-CM

## 2020-07-01 DIAGNOSIS — Z3A18 18 weeks gestation of pregnancy: Secondary | ICD-10-CM

## 2020-07-01 DIAGNOSIS — G40909 Epilepsy, unspecified, not intractable, without status epilepticus: Secondary | ICD-10-CM

## 2020-07-01 DIAGNOSIS — O2340 Unspecified infection of urinary tract in pregnancy, unspecified trimester: Secondary | ICD-10-CM

## 2020-07-01 NOTE — Progress Notes (Signed)
PRENATAL VISIT NOTE  Subjective:  Cassandra Thomas is a 30 y.o. [phone] at [redacted]w[redacted]d being seen today for ongoing prenatal care.  She is currently monitored for the following issues for this high-risk pregnancy and has Seizure disorder (HCC); Supervision of high risk pregnancy, antepartum; History of acute pancreatitis; Current nicotine use; Short interval between pregnancies affecting pregnancy, antepartum; History of cesarean delivery; Asymptomatic bacteriuria during pregnancy; and [redacted] weeks gestation of pregnancy on their problem list.  Patient reports no complaints.  Contractions: Not present. Vag. Bleeding: None.  Movement: Absent. Denies leaking of fluid. S/p flu and covid vaccines. Plan for vasectomy. Plan to breast and bottle feeding. Currently vaping but motivated to achieve complete cessation. Undecided regarding TOLAC.   The following portions of the patient's history were reviewed and updated as appropriate: allergies, current medications, past family history, past medical history, past social history, past surgical history and problem list.   Objective:   Vitals:   07/01/20 1035  BP: 133/71  Pulse: 84  Weight: 114 lb 3.2 oz (51.8 kg)    Fetal Status: Fetal Heart Rate (bpm): 143   Movement: Absent     General:  Alert, oriented and cooperative. Patient is in no acute distress.  Skin: Skin is warm and dry. No rash noted.   Cardiovascular: Normal heart rate noted  Respiratory: Normal respiratory effort, no problems with respiration noted  Abdomen: Soft, gravid, appropriate for gestational age.  Pain/Pressure: Present     Pelvic: Cervical exam deferred        Extremities: Normal range of motion.  Edema: None  Mental Status: Normal mood and affect. Normal behavior. Normal judgment and thought content.   Assessment and Plan:  Pregnancy: G3P0111 at [redacted]w[redacted]d  1. [redacted] weeks gestation of pregnancy, Supervision of high risk pregnancy, antepartum: No red flag signs/symptoms. Normal BP  today. +FHTs in clinic today. No fetal movement yet. Pt has anatomy scan scheduled for 07/05/20. S/p flu and Covid vaccines. Desires partner vasectomy for birth control. Plans to breast and bottle feed. - continue daily prenatal vitamin - encouraged complete cessation from vaping as noted below - rtc in 4 weeks or sooner as needed for strict return precautions  2. Asymptomatic bacteriuria during pregnancy: Pt with asymptomatic bacteruria (Klebsiella diagnosed 06/03/20). Pt s/p Augmentin course. - Culture, OB Urine for TOC today  3. History of cesarean delivery: Pt with h/o Cesarean x1 in setting of breech presentation. Pt declined ECV given her prior presentation in active labor. - Reviewed risks/benefits of TOLAC versus RCS in detail. Patient counseled regarding potential vaginal delivery, chance of success (83.6% predicted success per VBAC calculator), future implications, possible uterine rupture and need for urgent/emergent repeat cesarean. Counseled regarding potential need for repeat c-section for reasons unrelated to first c-section. Counseled regarding scheduled repeat cesarean including risks of bleeding, infection, damage to surrounding tissue, abnormal placentation, implications for future pregnancies. All questions answered. Pt remains undecided regarding TOLAC vs repeat Cesarean today s/p counseling. Encouraged her to continue conversation with her partner with plan to re-address at f/u prenatal appt.  4. Current nicotine use: Pt weaning off vaping. - motivated to quit  5. Short Interval Pregnancy: Currently bottle feeding her seven-month-old. Pt does NOT desire future pregnancies. - plan for partner vasectomy  6. Seizure Disorder: Stopped Keppra after last delivery. Last seizure 4 years ago. Pt is not currently followed by Neurology (last saw Neurology in [place]). - provided strict return precautions for seizure-like activity - Amb Referral to Neurology today  7.  Vaping in  pregnancy: Pt reports specific plan to move towards complete cessation.  - praised pt for her efforts & emphasized importance of complete cessation  Preterm labor symptoms and general obstetric precautions including but not limited to vaginal bleeding, contractions, leaking of fluid and fetal movement were reviewed in detail with the patient. Please refer to After Visit Summary for other counseling recommendations.   Return in about 4 weeks (around 07/29/2020) for f/u prenatal visit in person, any provider.  Future Appointments  Date Time Provider Department Center  07/05/2020 12:45 PM [facility] NURSE [facility]  07/05/2020  1:00 PM [facility] US1 [facility]  07/29/2020  8:55 AM Magnus Sinning, [place], PA-C [facility]   Sheila Oats, MD OB Fellow, [facility] 07/01/2020 1:54 PM

## 2020-07-01 NOTE — Progress Notes (Signed)
Informed bedside ultrasound done to obtain FHR, FHR 143bpm

## 2020-07-01 NOTE — Progress Notes (Signed)
Pt states having intermittent abd cramps with no vaginal bleeding.

## 2020-07-01 NOTE — Patient Instructions (Signed)

## 2020-07-03 LAB — CULTURE, OB URINE

## 2020-07-03 LAB — URINE CULTURE, OB REFLEX

## 2020-07-05 ENCOUNTER — Ambulatory Visit: Payer: 59 | Admitting: *Deleted

## 2020-07-05 ENCOUNTER — Encounter: Payer: Self-pay | Admitting: *Deleted

## 2020-07-05 ENCOUNTER — Other Ambulatory Visit: Payer: Self-pay

## 2020-07-05 ENCOUNTER — Ambulatory Visit: Payer: 59 | Attending: Family Medicine

## 2020-07-05 DIAGNOSIS — O0990 Supervision of high risk pregnancy, unspecified, unspecified trimester: Secondary | ICD-10-CM | POA: Diagnosis not present

## 2020-07-05 DIAGNOSIS — O09899 Supervision of other high risk pregnancies, unspecified trimester: Secondary | ICD-10-CM

## 2020-07-06 ENCOUNTER — Other Ambulatory Visit: Payer: Self-pay | Admitting: *Deleted

## 2020-07-06 DIAGNOSIS — Z3492 Encounter for supervision of normal pregnancy, unspecified, second trimester: Secondary | ICD-10-CM

## 2020-07-08 ENCOUNTER — Ambulatory Visit: Payer: 59

## 2020-07-29 ENCOUNTER — Other Ambulatory Visit: Payer: Self-pay

## 2020-07-29 ENCOUNTER — Ambulatory Visit (INDEPENDENT_AMBULATORY_CARE_PROVIDER_SITE_OTHER): Payer: 59 | Admitting: Medical

## 2020-07-29 VITALS — BP 104/64 | HR 89 | Wt 120.3 lb

## 2020-07-29 DIAGNOSIS — O99891 Other specified diseases and conditions complicating pregnancy: Secondary | ICD-10-CM

## 2020-07-29 DIAGNOSIS — Z98891 History of uterine scar from previous surgery: Secondary | ICD-10-CM

## 2020-07-29 DIAGNOSIS — O0990 Supervision of high risk pregnancy, unspecified, unspecified trimester: Secondary | ICD-10-CM

## 2020-07-29 DIAGNOSIS — R8271 Bacteriuria: Secondary | ICD-10-CM

## 2020-07-29 DIAGNOSIS — Z3A20 20 weeks gestation of pregnancy: Secondary | ICD-10-CM

## 2020-07-29 DIAGNOSIS — O09899 Supervision of other high risk pregnancies, unspecified trimester: Secondary | ICD-10-CM

## 2020-07-29 DIAGNOSIS — G40909 Epilepsy, unspecified, not intractable, without status epilepticus: Secondary | ICD-10-CM

## 2020-07-29 DIAGNOSIS — Z8751 Personal history of pre-term labor: Secondary | ICD-10-CM | POA: Insufficient documentation

## 2020-07-29 NOTE — Patient Instructions (Signed)

## 2020-07-29 NOTE — Progress Notes (Signed)
   PRENATAL VISIT NOTE  Subjective:  Cassandra Thomas is a 30 y.o. [phone] at [redacted]w[redacted]d being seen today for ongoing prenatal care.  She is currently monitored for the following issues for this high-risk pregnancy and has Seizure disorder (HCC); Supervision of high risk pregnancy, antepartum; History of acute pancreatitis; Current nicotine use; Short interval between pregnancies affecting pregnancy, antepartum; History of cesarean delivery; Asymptomatic bacteriuria during pregnancy; and History of preterm delivery on their problem list.  Patient reports no complaints.  Contractions: Not present. Vag. Bleeding: None.  Movement: Present. Denies leaking of fluid.   The following portions of the patient's history were reviewed and updated as appropriate: allergies, current medications, past family history, past medical history, past social history, past surgical history and problem list.   Objective:   Vitals:   07/29/20 0857  BP: 104/64  Pulse: 89  Weight: 120 lb 4.8 oz (54.6 kg)    Fetal Status: Fetal Heart Rate (bpm): 159 Fundal Height: 19 cm Movement: Present     General:  Alert, oriented and cooperative. Patient is in no acute distress.  Skin: Skin is warm and dry. No rash noted.   Cardiovascular: Normal heart rate noted  Respiratory: Normal respiratory effort, no problems with respiration noted  Abdomen: Soft, gravid, appropriate for gestational age.  Pain/Pressure: Absent     Pelvic: Cervical exam deferred        Extremities: Normal range of motion.  Edema: None  Mental Status: Normal mood and affect. Normal behavior. Normal judgment and thought content.   Assessment and Plan:  Pregnancy: [id number] at [redacted]w[redacted]d 1. Supervision of high risk pregnancy, antepartum - Reviewed last [place], due date changed to 12/14/20 based due to uncertain LMP and EGA  - Follow-up growth [place] scheduled 08/03/20 - Smoking and tobacco cessation was discussed at today's visit for 5 minutes. Patient is weaning amount  of nicotine in vape pens over time.   2. History of preterm delivery - @ [redacted]w[redacted]d due to preterm labor with breech presentation   3. History of cesarean delivery - Discussed plan for MOD, would like to schedule repeat for 39 weeks, however may decide to [facility] with spontaneous labor   4. Short interval between pregnancies affecting pregnancy, antepartum - Last child born 10/2019  5. Seizure disorder (HCC)  6. Asymptomatic bacteriuria during pregnancy - TOC normal at last visit   7. [redacted] weeks gestation of pregnancy  Preterm labor symptoms and general obstetric precautions including but not limited to vaginal bleeding, contractions, leaking of fluid and fetal movement were reviewed in detail with the patient. Please refer to After Visit Summary for other counseling recommendations.   Return in about 4 weeks (around 08/26/2020) for [facility] APP, In-Person.  Future Appointments  Date Time Provider Department Center  08/03/2020  3:30 PM [facility] NURSE [facility]  08/03/2020  3:45 PM [facility] US4 [facility]  08/26/2020  8:35 AM Adam Phenix, MD [facility]    Vonzella Nipple, PA-C

## 2020-08-03 ENCOUNTER — Encounter: Payer: Self-pay | Admitting: *Deleted

## 2020-08-03 ENCOUNTER — Ambulatory Visit: Payer: 59 | Attending: Obstetrics and Gynecology

## 2020-08-03 ENCOUNTER — Other Ambulatory Visit: Payer: Self-pay

## 2020-08-03 ENCOUNTER — Ambulatory Visit: Payer: 59 | Admitting: *Deleted

## 2020-08-03 DIAGNOSIS — O322XX0 Maternal care for transverse and oblique lie, not applicable or unspecified: Secondary | ICD-10-CM

## 2020-08-03 DIAGNOSIS — O09212 Supervision of pregnancy with history of pre-term labor, second trimester: Secondary | ICD-10-CM | POA: Diagnosis not present

## 2020-08-03 DIAGNOSIS — O99352 Diseases of the nervous system complicating pregnancy, second trimester: Secondary | ICD-10-CM

## 2020-08-03 DIAGNOSIS — G43909 Migraine, unspecified, not intractable, without status migrainosus: Secondary | ICD-10-CM

## 2020-08-03 DIAGNOSIS — Z362 Encounter for other antenatal screening follow-up: Secondary | ICD-10-CM

## 2020-08-03 DIAGNOSIS — O09899 Supervision of other high risk pregnancies, unspecified trimester: Secondary | ICD-10-CM

## 2020-08-03 DIAGNOSIS — Z8751 Personal history of pre-term labor: Secondary | ICD-10-CM

## 2020-08-03 DIAGNOSIS — Z3A21 21 weeks gestation of pregnancy: Secondary | ICD-10-CM

## 2020-08-03 DIAGNOSIS — Z3492 Encounter for supervision of normal pregnancy, unspecified, second trimester: Secondary | ICD-10-CM | POA: Diagnosis present

## 2020-08-03 DIAGNOSIS — O34219 Maternal care for unspecified type scar from previous cesarean delivery: Secondary | ICD-10-CM | POA: Diagnosis not present

## 2020-08-26 ENCOUNTER — Other Ambulatory Visit: Payer: Self-pay

## 2020-08-26 ENCOUNTER — Ambulatory Visit (INDEPENDENT_AMBULATORY_CARE_PROVIDER_SITE_OTHER): Payer: 59 | Admitting: Obstetrics & Gynecology

## 2020-08-26 VITALS — BP 115/71 | HR 83 | Wt 124.0 lb

## 2020-08-26 DIAGNOSIS — G40909 Epilepsy, unspecified, not intractable, without status epilepticus: Secondary | ICD-10-CM

## 2020-08-26 DIAGNOSIS — O0990 Supervision of high risk pregnancy, unspecified, unspecified trimester: Secondary | ICD-10-CM

## 2020-08-26 DIAGNOSIS — O09899 Supervision of other high risk pregnancies, unspecified trimester: Secondary | ICD-10-CM

## 2020-08-26 NOTE — Progress Notes (Signed)
   PRENATAL VISIT NOTE  Subjective:  Cassandra Thomas is a 30 y.o. [phone] at [redacted]w[redacted]d being seen today for ongoing prenatal care.  She is currently monitored for the following issues for this low-risk pregnancy and has Seizure disorder (HCC); Supervision of high risk pregnancy, antepartum; History of acute pancreatitis; Current nicotine use; Short interval between pregnancies affecting pregnancy, antepartum; History of cesarean delivery; Asymptomatic bacteriuria during pregnancy; and History of preterm delivery on their problem list.  Patient reports no complaints.  Contractions: Not present. Vag. Bleeding: None.  Movement: Present. Denies leaking of fluid.   The following portions of the patient's history were reviewed and updated as appropriate: allergies, current medications, past family history, past medical history, past social history, past surgical history and problem list.   Objective:   Vitals:   08/26/20 0841  BP: 115/71  Pulse: 83  Weight: 124 lb (56.2 kg)    Fetal Status: Fetal Heart Rate (bpm): 154   Movement: Present     General:  Alert, oriented and cooperative. Patient is in no acute distress.  Skin: Skin is warm and dry. No rash noted.   Cardiovascular: Normal heart rate noted  Respiratory: Normal respiratory effort, no problems with respiration noted  Abdomen: Soft, gravid, appropriate for gestational age.  Pain/Pressure: Absent     Pelvic: Cervical exam deferred        Extremities: Normal range of motion.  Edema: None  Mental Status: Normal mood and affect. Normal behavior. Normal judgment and thought content.   Assessment and Plan:  Pregnancy: [id number] at [redacted]w[redacted]d 1. Short interval between pregnancies affecting pregnancy, antepartum EDC by serial [place]  2. Supervision of high risk pregnancy, antepartum H/o CS for breech leaning toward RCS possible TOLAC if labors  3. Seizure disorder (HCC) stable  Preterm labor symptoms and general obstetric precautions  including but not limited to vaginal bleeding, contractions, leaking of fluid and fetal movement were reviewed in detail with the patient. Please refer to After Visit Summary for other counseling recommendations.   Return in about 4 weeks (around 09/23/2020) for 2 hr GTT.  No future appointments.  Scheryl Darter, MD

## 2020-08-26 NOTE — Patient Instructions (Signed)
Vaginal Birth After Cesarean Delivery  Vaginal birth after cesarean delivery (VBAC) is giving birth vaginally after previously delivering a baby through a cesarean section (C-section). A VBAC may be a safe option for you, depending on your health and other factors. It is important to discuss VBAC with your health care provider early in your pregnancy so you can understand the risks, benefits, and options. Having these discussions early will give you time to make your birth plan. Who are the best candidates for VBAC? The best candidates for VBAC are women who:  Have had one or two prior cesarean deliveries, and the incision made during the delivery was horizontal (low transverse).  Do not have a vertical (classical) scar on their uterus.  Have not had a tear in the wall of their uterus (uterine rupture).  Plan to have more pregnancies. A VBAC is also more likely to be successful:  In women who have previously given birth vaginally.  When labor starts by itself (spontaneously) before the due date. What are the benefits of VBAC? The benefits of delivering your baby vaginally instead of by a cesarean delivery include:  A shorter hospital stay.  A faster recovery time.  Less pain.  Avoiding risks associated with major surgery, such as infection and blood clots.  Less blood loss and less need for donated blood (transfusions). What are the risks of VBAC? The main risk of attempting a VBAC is that it may fail, forcing your health care provider to deliver your baby by a C-section. Other risks are rare and include:  Tearing (rupture) of the scar from a past cesarean delivery.  Other risks associated with vaginal deliveries. If a repeat cesarean delivery is needed, the risks include:  Blood loss.  Infection.  Blood clot.  Damage to surrounding organs.  Removal of the uterus (hysterectomy), if it is damaged.  Placenta problems in future pregnancies. What else should I know  about my options? Delivering a baby through a VBAC is similar to having a normal spontaneous vaginal delivery. Therefore, it is safe:  To try with twins.  For your health care provider to try to turn the baby from a breech position (external cephalic version) during labor.  With epidural analgesia for pain relief. Consider where you would like to deliver your baby. VBAC should be attempted in facilities where an emergency cesarean delivery can be performed. VBAC is not recommended for home births. Any changes in your health or your baby's health during your pregnancy may make it necessary to change your initial decision about VBAC. Your health care provider may recommend that you do not attempt a VBAC if:  Your baby's suspected weight is 8.8 lb (4 kg) or more.  You have preeclampsia. This is a condition that causes high blood pressure along with other symptoms, such as swelling and headaches.  You will have VBAC less than 19 months after your cesarean delivery.  You are past your due date.  You need to have labor started (induced) because your cervix is not ready for labor (unfavorable). Where to find more information  American Pregnancy Association: americanpregnancy.org  American Congress of Obstetricians and Gynecologists: acog.org Summary  Vaginal birth after cesarean delivery (VBAC) is giving birth vaginally after previously delivering a baby through a cesarean section (C-section). A VBAC may be a safe option for you, depending on your health and other factors.  Discuss VBAC with your health care provider early in your pregnancy so you can understand the risks, benefits, options, and   have plenty of time to make your birth plan.  The main risk of attempting a VBAC is that it may fail, forcing your health care provider to deliver your baby by a C-section. Other risks are rare. This information is not intended to replace advice given to you by your health care provider. Make sure  you discuss any questions you have with your health care provider. Document Revised: 10/07/2018 Document Reviewed: 09/18/2016 Elsevier Patient Education  2021 Elsevier Inc.  

## 2020-09-20 ENCOUNTER — Other Ambulatory Visit: Payer: Self-pay | Admitting: Lactation Services

## 2020-09-20 DIAGNOSIS — O0990 Supervision of high risk pregnancy, unspecified, unspecified trimester: Secondary | ICD-10-CM

## 2020-09-23 ENCOUNTER — Ambulatory Visit (INDEPENDENT_AMBULATORY_CARE_PROVIDER_SITE_OTHER): Payer: 59 | Admitting: Obstetrics and Gynecology

## 2020-09-23 ENCOUNTER — Other Ambulatory Visit: Payer: 59

## 2020-09-23 ENCOUNTER — Other Ambulatory Visit: Payer: Self-pay

## 2020-09-23 ENCOUNTER — Encounter: Payer: Self-pay | Admitting: Obstetrics and Gynecology

## 2020-09-23 VITALS — BP 91/71 | HR 100 | Wt 124.0 lb

## 2020-09-23 DIAGNOSIS — Z23 Encounter for immunization: Secondary | ICD-10-CM | POA: Diagnosis not present

## 2020-09-23 DIAGNOSIS — Z3A28 28 weeks gestation of pregnancy: Secondary | ICD-10-CM | POA: Diagnosis not present

## 2020-09-23 DIAGNOSIS — O0990 Supervision of high risk pregnancy, unspecified, unspecified trimester: Secondary | ICD-10-CM

## 2020-09-23 DIAGNOSIS — Z8751 Personal history of pre-term labor: Secondary | ICD-10-CM

## 2020-09-23 DIAGNOSIS — G40909 Epilepsy, unspecified, not intractable, without status epilepticus: Secondary | ICD-10-CM

## 2020-09-23 DIAGNOSIS — Z98891 History of uterine scar from previous surgery: Secondary | ICD-10-CM

## 2020-09-23 NOTE — Patient Instructions (Signed)

## 2020-09-23 NOTE — Progress Notes (Signed)
Subjective:  Cassandra Thomas is a 30 y.o. [phone] at [redacted]w[redacted]d being seen today for ongoing prenatal care.  She is currently monitored for the following issues for this high-risk pregnancy and has Seizure disorder (HCC); Supervision of high risk pregnancy, antepartum; History of acute pancreatitis; Current nicotine use; Short interval between pregnancies affecting pregnancy, antepartum; History of cesarean delivery; Asymptomatic bacteriuria during pregnancy; and History of preterm delivery on their problem list.  Patient reports no complaints.  Contractions: Not present. Vag. Bleeding: None.  Movement: Present. Denies leaking of fluid.   The following portions of the patient's history were reviewed and updated as appropriate: allergies, current medications, past family history, past medical history, past social history, past surgical history and problem list. Problem list updated.  Objective:   Vitals:   09/23/20 0835  BP: 91/71  Pulse: 100  Weight: 124 lb (56.2 kg)    Fetal Status: Fetal Heart Rate (bpm): 148   Movement: Present     General:  Alert, oriented and cooperative. Patient is in no acute distress.  Skin: Skin is warm and dry. No rash noted.   Cardiovascular: Normal heart rate noted  Respiratory: Normal respiratory effort, no problems with respiration noted  Abdomen: Soft, gravid, appropriate for gestational age. Pain/Pressure: Absent     Pelvic:  Cervical exam deferred        Extremities: Normal range of motion.  Edema: None  Mental Status: Normal mood and affect. Normal behavior. Normal judgment and thought content.   Urinalysis:      Assessment and Plan:  Pregnancy: G3P0111 at [redacted]w[redacted]d  1. [redacted] weeks gestation of pregnancy Stable Glucola today - Tdap vaccine greater than or equal to 7yo IM  2. Supervision of high risk pregnancy, antepartum See above  3. History of cesarean delivery C section at 39 weeks unless SOL  4. Seizure disorder (HCC) Stable  5. History of  preterm delivery No evidence at present Prophylactic medication not indicated  Preterm labor symptoms and general obstetric precautions including but not limited to vaginal bleeding, contractions, leaking of fluid and fetal movement were reviewed in detail with the patient. Please refer to After Visit Summary for other counseling recommendations.  Return in about 2 weeks (around 10/07/2020) for OB visit, virtual, any provider.   Hermina Staggers, MD

## 2020-09-24 LAB — CBC
Hematocrit: 34.5 % (ref 34.0–46.6)
Hemoglobin: 11.8 g/dL (ref 11.1–15.9)
MCH: 31.9 pg (ref 26.6–33.0)
MCHC: 34.2 g/dL (ref 31.5–35.7)
MCV: 93 fL (ref 79–97)
Platelets: 166 10*3/uL (ref 150–450)
RBC: 3.7 x10E6/uL — ABNORMAL LOW (ref 3.77–5.28)
RDW: 11.8 % (ref 11.7–15.4)
WBC: 8.3 10*3/uL (ref 3.4–10.8)

## 2020-09-24 LAB — GLUCOSE TOLERANCE, 2 HOURS W/ 1HR
Glucose, 1 hour: 102 mg/dL (ref 65–179)
Glucose, 2 hour: 128 mg/dL (ref 65–152)
Glucose, Fasting: 86 mg/dL (ref 65–91)

## 2020-09-24 LAB — RPR: RPR Ser Ql: NONREACTIVE

## 2020-09-24 LAB — HIV ANTIBODY (ROUTINE TESTING W REFLEX): HIV Screen 4th Generation wRfx: NONREACTIVE

## 2020-10-06 ENCOUNTER — Inpatient Hospital Stay (HOSPITAL_COMMUNITY)
Admission: AD | Admit: 2020-10-06 | Discharge: 2020-10-06 | Disposition: A | Discharge status: Home / Self Care | Payer: 59 | Attending: Obstetrics and Gynecology | Admitting: Obstetrics and Gynecology

## 2020-10-06 ENCOUNTER — Encounter (HOSPITAL_COMMUNITY): Payer: Self-pay | Admitting: Obstetrics and Gynecology

## 2020-10-06 ENCOUNTER — Other Ambulatory Visit: Payer: Self-pay

## 2020-10-06 DIAGNOSIS — Z3689 Encounter for other specified antenatal screening: Secondary | ICD-10-CM

## 2020-10-06 DIAGNOSIS — O26893 Other specified pregnancy related conditions, third trimester: Secondary | ICD-10-CM | POA: Diagnosis not present

## 2020-10-06 DIAGNOSIS — Z3A30 30 weeks gestation of pregnancy: Secondary | ICD-10-CM | POA: Diagnosis not present

## 2020-10-06 DIAGNOSIS — R1032 Left lower quadrant pain: Secondary | ICD-10-CM | POA: Diagnosis present

## 2020-10-06 LAB — URINALYSIS, ROUTINE W REFLEX MICROSCOPIC
Bilirubin Urine: NEGATIVE
Glucose, UA: NEGATIVE mg/dL
Hgb urine dipstick: NEGATIVE
Ketones, ur: 5 mg/dL — AB
Leukocytes,Ua: NEGATIVE
Nitrite: NEGATIVE
Protein, ur: NEGATIVE mg/dL
Specific Gravity, Urine: 1.014 (ref 1.005–1.030)
pH: 7 (ref 5.0–8.0)

## 2020-10-06 NOTE — MAU Note (Signed)
Presents with c/o left sided abdominal pain that was sharp in nature lasting for 5 minutes, pain has since subsided.  Denies VB or LOF.  Endorses +FM.

## 2020-10-06 NOTE — MAU Provider Note (Signed)
Event Date/Time   First Provider Initiated Contact with Patient 10/06/20 1843     S [name] is a 30 y.o. [phone] pregnant female at [redacted]w[redacted]d who presents to MAU today with complaint of sudden onset left lower quadrant pain that began this afternoon when she was playing on the floor with her daughter. She could barely walk due to the pain but was able to get to the bed. Laid down for awhile and the pain went away on its own within an hour. Presented to MAU out of concern but is no longer in pain.  O BP 111/62 (BP Location: Left Arm)   Pulse 67   Temp 98.2 F (36.8 C) (Oral)   Resp 19   Ht 5\' 9"  (1.753 m)   Wt 128 lb 12.8 oz (58.4 kg)   LMP 02/25/2020 (Approximate)   SpO2 100%   BMI 19.02 kg/m  Physical Exam Vitals and nursing note reviewed.  Constitutional:      General: She is not in acute distress.    Appearance: She is not ill-appearing.  HENT:     Head: Normocephalic and atraumatic.     Mouth/Throat:     Mouth: Mucous membranes are moist.  Eyes:     Pupils: Pupils are equal, round, and reactive to light.  Cardiovascular:     Rate and Rhythm: Normal rate.  Pulmonary:     Effort: Pulmonary effort is normal.  Abdominal:     General: Bowel sounds are normal. There is no distension.     Palpations: Abdomen is soft. There is no mass.     Tenderness: There is no abdominal tenderness. There is no guarding or rebound.  Genitourinary:    Uterus: Normal.      Adnexa: Right adnexa normal and left adnexa normal.  Skin:    General: Skin is warm and dry.     Capillary Refill: Capillary refill takes less than 2 seconds.  Neurological:     Mental Status: She is alert.  Psychiatric:        Mood and Affect: Mood normal.        Behavior: Behavior normal.    Fetal Tracing: reactive Baseline: 145 Variability: moderate Accelerations: 15x15 Decelerations: none Toco: UI  Leopold's maneuvers show fetal head is positioned right near area of pain  Reviewed possible  causes of acute but transient abdominal pain including round ligament, muscle spasm and gas pains. Discussed ways to prevent these pains and red flags in case this happens again. Pt verbalized understanding. A Abdominal pain in pregnancy, third trimester NST reactive  P Discharge from MAU in stable condition with return precautions Follow up at [facility] as scheduled for ongoing prenatal care  [facility], CNM 10/06/2020 6:48 PM

## 2020-10-06 NOTE — Discharge Instructions (Signed)

## 2020-10-10 ENCOUNTER — Telehealth (INDEPENDENT_AMBULATORY_CARE_PROVIDER_SITE_OTHER): Payer: 59 | Admitting: Family Medicine

## 2020-10-10 ENCOUNTER — Encounter: Payer: Self-pay | Admitting: *Deleted

## 2020-10-10 ENCOUNTER — Encounter: Payer: Self-pay | Admitting: Family Medicine

## 2020-10-10 ENCOUNTER — Other Ambulatory Visit: Payer: Self-pay | Admitting: Obstetrics and Gynecology

## 2020-10-10 VITALS — BP 115/55 | HR 81

## 2020-10-10 DIAGNOSIS — O99333 Smoking (tobacco) complicating pregnancy, third trimester: Secondary | ICD-10-CM

## 2020-10-10 DIAGNOSIS — O34219 Maternal care for unspecified type scar from previous cesarean delivery: Secondary | ICD-10-CM

## 2020-10-10 DIAGNOSIS — O09893 Supervision of other high risk pregnancies, third trimester: Secondary | ICD-10-CM

## 2020-10-10 DIAGNOSIS — O09899 Supervision of other high risk pregnancies, unspecified trimester: Secondary | ICD-10-CM

## 2020-10-10 DIAGNOSIS — O09213 Supervision of pregnancy with history of pre-term labor, third trimester: Secondary | ICD-10-CM

## 2020-10-10 DIAGNOSIS — Z8751 Personal history of pre-term labor: Secondary | ICD-10-CM

## 2020-10-10 DIAGNOSIS — O0990 Supervision of high risk pregnancy, unspecified, unspecified trimester: Secondary | ICD-10-CM

## 2020-10-10 DIAGNOSIS — O99353 Diseases of the nervous system complicating pregnancy, third trimester: Secondary | ICD-10-CM

## 2020-10-10 DIAGNOSIS — F17210 Nicotine dependence, cigarettes, uncomplicated: Secondary | ICD-10-CM

## 2020-10-10 DIAGNOSIS — G40909 Epilepsy, unspecified, not intractable, without status epilepticus: Secondary | ICD-10-CM

## 2020-10-10 DIAGNOSIS — Z720 Tobacco use: Secondary | ICD-10-CM

## 2020-10-10 DIAGNOSIS — Z3A30 30 weeks gestation of pregnancy: Secondary | ICD-10-CM

## 2020-10-10 NOTE — Progress Notes (Signed)
I connected with Cassandra Thomas 10/10/20 at  2:15 PM EDT by: MyChart video and verified that I am speaking with the correct person using two identifiers.  Patient is located at [facility] and provider is located at [facility].     The purpose of this virtual visit is to provide medical care while limiting exposure to the novel coronavirus. I discussed the limitations, risks, security and privacy concerns of performing an evaluation and management service by MyChart video and the availability of in person appointments. I also discussed with the patient that there may be a patient responsible charge related to this service. By engaging in this virtual visit, you consent to the provision of healthcare.  Additionally, you authorize for your insurance to be billed for the services provided during this visit.  The patient expressed understanding and agreed to proceed.  The following staff members participated in the virtual visit:  Marylynn Pearson    PRENATAL VISIT NOTE  Subjective:  Cassandra Thomas is a 30 y.o. [phone] at [redacted]w[redacted]d  for phone visit for ongoing prenatal care.  She is currently monitored for the following issues for this high-risk pregnancy and has Seizure disorder (HCC); Supervision of high risk pregnancy, antepartum; History of acute pancreatitis; Current nicotine use; Short interval between pregnancies affecting pregnancy, antepartum; History of cesarean delivery; Asymptomatic bacteriuria during pregnancy; and History of preterm delivery on their problem list.  Patient reports no complaints.  Contractions: Not present. Vag. Bleeding: None.  Movement: Present. Denies leaking of fluid.   The following portions of the patient's history were reviewed and updated as appropriate: allergies, current medications, past family history, past medical history, past social history, past surgical history and problem list.   Objective:   Vitals:   10/10/20 1415  BP: (!) 115/55   Pulse: 81   Self-Obtained  Fetal Status:     Movement: Present     Assessment and Plan:  Pregnancy: [id number] at [redacted]w[redacted]d 1. History of preterm delivery @[redacted]w[redacted]d - not on 17 P  2. Short interval between pregnancies affecting pregnancy, antepartum  3. Supervision of high risk pregnancy, antepartum Desires RCS-- decided on this. Declined BTS. Partner will not get vasectomy yet. She desires Nexplanon placement in hospital.  Routed to to schedule  Up to date otherwise Prefers in person visit for next visit   4. Current nicotine use Continue to encourage cessation  Preterm labor symptoms and general obstetric precautions including but not limited to vaginal bleeding, contractions, leaking of fluid and fetal movement were reviewed in detail with the patient.  Return in about 2 weeks (around 10/24/2020) for Routine prenatal care, in person, MD or APP.  No future appointments.   Time spent on virtual visit: 10 minutes  12/24/2020, MD

## 2020-10-11 ENCOUNTER — Telehealth: Payer: Self-pay | Admitting: *Deleted

## 2020-10-11 NOTE — Telephone Encounter (Signed)
Call to patient. Notified RCS scheduled for 12-07-20 0930, arrive 0730. Letter and additional information mailed.   Encounter closed.

## 2020-11-22 ENCOUNTER — Telehealth: Payer: Self-pay | Admitting: Family Medicine

## 2020-11-22 NOTE — Telephone Encounter (Signed)
Patient is 36 weeks and having a lot of pain, would like a call back

## 2020-11-22 NOTE — Telephone Encounter (Signed)
Returned patients call.   Patient reports she just spoke with someone. She reports around 7 am her contractions have become more regular this morning about 10-12 minutes apart. She reports her older child was born at 36.5 weeks. Cassandra Thomas is not moving as well either, patient is planning to go to MAU for evaluation at recommendation of Triage nurse.

## 2020-11-23 ENCOUNTER — Other Ambulatory Visit: Payer: Self-pay

## 2020-11-23 ENCOUNTER — Encounter (HOSPITAL_COMMUNITY): Payer: Self-pay | Admitting: Obstetrics and Gynecology

## 2020-11-23 ENCOUNTER — Inpatient Hospital Stay (EMERGENCY_DEPARTMENT_HOSPITAL)
Admission: AD | Admit: 2020-11-23 | Discharge: 2020-11-23 | Disposition: A | Discharge status: Home / Self Care | Payer: 59 | Source: Home / Self Care | Attending: Obstetrics and Gynecology | Admitting: Obstetrics and Gynecology

## 2020-11-23 ENCOUNTER — Inpatient Hospital Stay (HOSPITAL_COMMUNITY)
Admission: AD | Admit: 2020-11-23 | Discharge: 2020-11-23 | Disposition: A | Discharge status: Home / Self Care | Payer: 59 | Source: Home / Self Care | Attending: Obstetrics and Gynecology | Admitting: Obstetrics and Gynecology

## 2020-11-23 ENCOUNTER — Inpatient Hospital Stay (HOSPITAL_COMMUNITY)
Admission: AD | Admit: 2020-11-23 | Discharge: 2020-11-26 | DRG: 787 | Disposition: A | Discharge status: Home / Self Care | Payer: 59 | Attending: Obstetrics and Gynecology | Admitting: Obstetrics and Gynecology

## 2020-11-23 DIAGNOSIS — O99334 Smoking (tobacco) complicating childbirth: Secondary | ICD-10-CM | POA: Diagnosis present

## 2020-11-23 DIAGNOSIS — O34211 Maternal care for low transverse scar from previous cesarean delivery: Principal | ICD-10-CM | POA: Diagnosis present

## 2020-11-23 DIAGNOSIS — Z3A37 37 weeks gestation of pregnancy: Secondary | ICD-10-CM

## 2020-11-23 DIAGNOSIS — O4703 False labor before 37 completed weeks of gestation, third trimester: Secondary | ICD-10-CM

## 2020-11-23 DIAGNOSIS — O471 False labor at or after 37 completed weeks of gestation: Secondary | ICD-10-CM

## 2020-11-23 DIAGNOSIS — O9081 Anemia of the puerperium: Secondary | ICD-10-CM | POA: Diagnosis not present

## 2020-11-23 DIAGNOSIS — Z8751 Personal history of pre-term labor: Secondary | ICD-10-CM

## 2020-11-23 DIAGNOSIS — O09899 Supervision of other high risk pregnancies, unspecified trimester: Secondary | ICD-10-CM

## 2020-11-23 DIAGNOSIS — Z79899 Other long term (current) drug therapy: Secondary | ICD-10-CM | POA: Insufficient documentation

## 2020-11-23 DIAGNOSIS — D62 Acute posthemorrhagic anemia: Secondary | ICD-10-CM | POA: Diagnosis not present

## 2020-11-23 DIAGNOSIS — Z20822 Contact with and (suspected) exposure to covid-19: Secondary | ICD-10-CM | POA: Diagnosis present

## 2020-11-23 DIAGNOSIS — O479 False labor, unspecified: Secondary | ICD-10-CM

## 2020-11-23 DIAGNOSIS — Z30017 Encounter for initial prescription of implantable subdermal contraceptive: Secondary | ICD-10-CM

## 2020-11-23 DIAGNOSIS — F17210 Nicotine dependence, cigarettes, uncomplicated: Secondary | ICD-10-CM | POA: Insufficient documentation

## 2020-11-23 DIAGNOSIS — F17290 Nicotine dependence, other tobacco product, uncomplicated: Secondary | ICD-10-CM | POA: Diagnosis present

## 2020-11-23 DIAGNOSIS — O26893 Other specified pregnancy related conditions, third trimester: Secondary | ICD-10-CM | POA: Diagnosis not present

## 2020-11-23 DIAGNOSIS — O328XX0 Maternal care for other malpresentation of fetus, not applicable or unspecified: Secondary | ICD-10-CM | POA: Diagnosis present

## 2020-11-23 DIAGNOSIS — O99333 Smoking (tobacco) complicating pregnancy, third trimester: Secondary | ICD-10-CM | POA: Insufficient documentation

## 2020-11-23 MED ORDER — MORPHINE SULFATE (PF) 4 MG/ML IV SOLN
10.0000 mg | Freq: Once | INTRAVENOUS | Status: AC
  Administered 2020-11-23: 10 mg via INTRAMUSCULAR
  Filled 2020-11-23: qty 3

## 2020-11-23 MED ORDER — PROMETHAZINE HCL 25 MG/ML IJ SOLN
12.5000 mg | Freq: Once | INTRAMUSCULAR | Status: AC
  Administered 2020-11-23: 12.5 mg via INTRAMUSCULAR
  Filled 2020-11-23: qty 1

## 2020-11-23 NOTE — MAU Note (Signed)
Contractions getting stronger and closer, 3 while waiting in lobby. Denies bleeding or leaking.

## 2020-11-23 NOTE — MAU Provider Note (Addendum)
History     CSN: [id number]  Arrival date and time: 11/23/20 1512   Event Date/Time   First Provider Initiated Contact with Patient 11/23/20 1752      Chief Complaint  Patient presents with  . Contractions   [name] is a 30 y.o. year old [id number] female at [redacted]w[redacted]d weeks gestation who presents to MAU reporting contractions that are stronger and closer together. She was seen in MAU and discharged around 0900 this morning. She and her spouse are requesting to speak to a provider about the plan and why she is not going to have a cesarean delivery before the scheduled time on 12/07/2020. She had a She receives [facility]; last appt was 10/10/2020. Her spouse is present and contributing to the history taking.   OB History    Gravida  3   Para  1   Term  0   Preterm  1   AB  1   Living  1     SAB  1   IAB  0   Ectopic  0   Multiple  0   Live Births  1           Past Medical History:  Diagnosis Date  . Breech presentation 11/07/2019  . PONV (postoperative nausea and vomiting)   . Seizures (HCC)    last seizure 3 years ago    Past Surgical History:  Procedure Laterality Date  . CESAREAN SECTION N/A 11/07/2019   Procedure: CESAREAN SECTION;  Surgeon: Tereso Newcomer, MD;  Location: [facility] LD ORS;  Service: Obstetrics;  Laterality: N/A;  . CHOLECYSTECTOMY  2018   approx 2018 per pt  . KNEE SURGERY Right   . NO PAST SURGERIES    . WISDOM TOOTH EXTRACTION      Family History  Problem Relation Age of Onset  . Cancer Maternal Grandmother   . Healthy Mother   . Healthy Father     Social History   Tobacco Use  . Smoking status: Current Every Day Smoker    Years: 6.00    Types: E-cigarettes  . Smokeless tobacco: Never Used  Vaping Use  . Vaping Use: Some days  . Substances: Nicotine  Substance Use Topics  . Alcohol use: Never  . Drug use: Not Currently    Frequency: 4.0 times per week    Types: Marijuana    Comment: last was 3 days ago     Allergies:  Allergies  Allergen Reactions  . Codeine Hives    Medications Prior to Admission  Medication Sig Dispense Refill Last Dose  . calcium carbonate (TUMS - DOSED IN MG ELEMENTAL CALCIUM) 500 MG chewable tablet Chew 1 tablet by mouth as needed for indigestion or heartburn.     . Prenatal Vit-Fe Fumarate-FA (PRENATAL MULTIVITAMIN) TABS tablet Take 1 tablet by mouth daily at 12 noon.       Review of Systems  Constitutional: Negative.   HENT: Negative.   Eyes: Negative.   Respiratory: Negative.   Cardiovascular: Negative.   Gastrointestinal: Negative.   Endocrine: Negative.   Genitourinary: Positive for pelvic pain (UC's).  Musculoskeletal: Negative.   Skin: Negative.   Allergic/Immunologic: Negative.   Neurological: Negative.   Hematological: Negative.   Psychiatric/Behavioral: Negative.    Physical Exam   Blood pressure 116/77, pulse 86, temperature 98.2 F (36.8 C), temperature source Oral, resp. rate 16, last menstrual period 02/25/2020, SpO2 100 %, not currently breastfeeding.  Physical Exam Vitals and nursing  note reviewed. Exam conducted with a chaperone present.  Constitutional:      Appearance: Normal appearance. She is underweight.  Pulmonary:     Effort: Pulmonary effort is normal.  Abdominal:     Palpations: Abdomen is soft.  Genitourinary:    Comments: Dilation: 2 Effacement (%): 60 Cervical Position: Posterior Station: Ballotable Presentation: Vertex Exam by: Dr. Alysia Penna  Neurological:     Mental Status: She is alert and oriented to person, place, and time.  Psychiatric:        Mood and Affect: Mood normal.        Behavior: Behavior normal.        Thought Content: Thought content normal.        Judgment: Judgment normal.    REACTIVE NST - FHR: 135 bpm / moderate variability / accels present / decels absent / TOCO: regular every 2-3 mins  MAU Course  Procedures  MDM CEFM Morphine 10 mg IM -- verbal order given by Dr. Alysia Penna Phenergan  12.5 mg IM -- verbal order given by Dr. Alysia Penna Plan for therapeutic rest with Morphine 10 mg and Phenergan 12.5 mg IM discussed by Dr. Alysia Penna with patient and spouse -- both agree to plan. Patient states, "Sounds like a plan to me and I'll be able to go home and get some sleep since I have a little one at home." CNM explained to patient and spouse that after administration of medications, she and baby will be observed/monitored for 20-30 minutes for adverse reactions and then d/c'd home -- both agree. Assessment and Plan  False labor before 37 completed weeks of gestation in third trimester - Discussed extensively plan for therapeutic rest by Dr. Alysia Penna - Reassurance given that if she goes into active labor, she will not sleep through it - If she is not in labor, she will now be able to a good night's sleep tonight  [redacted] weeks gestation of pregnancy   - Discharge patient - Keep scheduled appt with [facility] on 11/28/2020 - Patient verbalized an understanding of the plan of care and agrees.    Raelyn Mora, CNM 11/23/2020, 5:53 PM

## 2020-11-23 NOTE — MAU Provider Note (Signed)
Ms. Icie Kuznicki is a [id number] at [redacted]w[redacted]d seen in MAU for labor. RN labor check, not seen by provider. SVE by RN Dilation: 1 Effacement (%): 50 Cervical Position: Anterior Station: -1,-2 Exam by: Kerin Salen, RN -- cervix unchanged from previous check  NST - FHR: 135 bpm / moderate variability / accels present / decels absent / TOCO: regular every 4-6 mins   Plan:  D/C home with labor precautions Keep scheduled appt with [facility] on 11/28/2020  Raelyn Mora, CNM  11/23/2020 8:52 AM

## 2020-11-23 NOTE — MAU Note (Signed)
Having ctx for a few days. This morning ctx closer and more painful.  Denies any vag bleeding or leaking.  Good fetal movement felt.

## 2020-11-24 ENCOUNTER — Inpatient Hospital Stay (HOSPITAL_COMMUNITY): Payer: 59 | Admitting: Anesthesiology

## 2020-11-24 ENCOUNTER — Encounter (HOSPITAL_COMMUNITY): Payer: Self-pay | Admitting: Obstetrics and Gynecology

## 2020-11-24 ENCOUNTER — Encounter (HOSPITAL_COMMUNITY)
Admission: AD | Disposition: A | Discharge status: Home / Self Care | Payer: Self-pay | Source: Home / Self Care | Attending: Obstetrics and Gynecology

## 2020-11-24 DIAGNOSIS — O34211 Maternal care for low transverse scar from previous cesarean delivery: Secondary | ICD-10-CM

## 2020-11-24 DIAGNOSIS — Z20822 Contact with and (suspected) exposure to covid-19: Secondary | ICD-10-CM | POA: Diagnosis present

## 2020-11-24 DIAGNOSIS — O9081 Anemia of the puerperium: Secondary | ICD-10-CM | POA: Diagnosis not present

## 2020-11-24 DIAGNOSIS — Z3A37 37 weeks gestation of pregnancy: Secondary | ICD-10-CM | POA: Diagnosis not present

## 2020-11-24 DIAGNOSIS — D62 Acute posthemorrhagic anemia: Secondary | ICD-10-CM | POA: Diagnosis not present

## 2020-11-24 DIAGNOSIS — F17290 Nicotine dependence, other tobacco product, uncomplicated: Secondary | ICD-10-CM | POA: Diagnosis not present

## 2020-11-24 DIAGNOSIS — O328XX0 Maternal care for other malpresentation of fetus, not applicable or unspecified: Secondary | ICD-10-CM

## 2020-11-24 DIAGNOSIS — O26893 Other specified pregnancy related conditions, third trimester: Secondary | ICD-10-CM | POA: Diagnosis present

## 2020-11-24 DIAGNOSIS — Z30017 Encounter for initial prescription of implantable subdermal contraceptive: Secondary | ICD-10-CM | POA: Diagnosis not present

## 2020-11-24 DIAGNOSIS — O99334 Smoking (tobacco) complicating childbirth: Secondary | ICD-10-CM | POA: Diagnosis not present

## 2020-11-24 LAB — CREATININE, SERUM
Creatinine, Ser: 0.63 mg/dL (ref 0.44–1.00)
GFR, Estimated: >60 mL/min (ref >60)

## 2020-11-24 LAB — CBC
HCT: 36.3 % (ref 36.0–46.0)
Hemoglobin: 12.4 g/dL (ref 12.0–15.0)
MCH: 31.9 pg (ref 26.0–34.0)
MCHC: 34.2 g/dL (ref 30.0–36.0)
MCV: 93.3 fL (ref 80.0–100.0)
Platelets: 168 10*3/uL (ref 150–400)
RBC: 3.89 MIL/uL (ref 3.87–5.11)
RDW: 13 % (ref 11.5–15.5)
WBC: 15.5 10*3/uL — ABNORMAL HIGH (ref 4.0–10.5)
nRBC: 0 % (ref 0.0–0.2)

## 2020-11-24 LAB — RESP PANEL BY RT-PCR (FLU A&B, COVID) ARPGX2
Influenza A by PCR: NEGATIVE
Influenza B by PCR: NEGATIVE
SARS Coronavirus 2 by RT PCR: NEGATIVE

## 2020-11-24 LAB — TYPE AND SCREEN
ABO/RH(D): O POS
Antibody Screen: NEGATIVE

## 2020-11-24 SURGERY — Surgical Case
Anesthesia: Spinal

## 2020-11-24 MED ORDER — DIPHENHYDRAMINE HCL 25 MG PO CAPS: 25.0000 mg | ORAL_CAPSULE | Freq: Four times a day (QID) | ORAL | Status: DC | PRN

## 2020-11-24 MED ORDER — NALBUPHINE HCL 10 MG/ML IJ SOLN: 5.0000 mg | INTRAMUSCULAR | Status: DC | PRN

## 2020-11-24 MED ORDER — FENTANYL CITRATE (PF) 100 MCG/2ML IJ SOLN
INTRAMUSCULAR | Status: DC | PRN
  Administered 2020-11-24: 85 ug via INTRAVENOUS

## 2020-11-24 MED ORDER — LACTATED RINGERS IV SOLN: INTRAVENOUS | Status: DC

## 2020-11-24 MED ORDER — SODIUM CHLORIDE 0.9 % IV SOLN
12.5000 mg | Freq: Four times a day (QID) | INTRAVENOUS | Status: DC | PRN
  Filled 2020-11-24 (×3): qty 0.5

## 2020-11-24 MED ORDER — COCONUT OIL OIL: 1.0000 "application " | TOPICAL_OIL | Status: DC | PRN

## 2020-11-24 MED ORDER — NALBUPHINE HCL 10 MG/ML IJ SOLN: 5.0000 mg | Freq: Once | INTRAMUSCULAR | Status: DC | PRN

## 2020-11-24 MED ORDER — KETOROLAC TROMETHAMINE 30 MG/ML IJ SOLN
INTRAMUSCULAR | Status: AC
  Filled 2020-11-24: qty 1

## 2020-11-24 MED ORDER — SCOPOLAMINE 1 MG/3DAYS TD PT72
1.0000 | MEDICATED_PATCH | Freq: Once | TRANSDERMAL | Status: DC
  Administered 2020-11-24: 1.5 mg via TRANSDERMAL

## 2020-11-24 MED ORDER — DEXAMETHASONE SODIUM PHOSPHATE 10 MG/ML IJ SOLN
INTRAMUSCULAR | Status: DC | PRN
  Administered 2020-11-24: 10 mg via INTRAVENOUS

## 2020-11-24 MED ORDER — SIMETHICONE 80 MG PO CHEW
80.0000 mg | CHEWABLE_TABLET | Freq: Three times a day (TID) | ORAL | Status: DC
  Administered 2020-11-24 – 2020-11-26 (×6): 80 mg via ORAL
  Filled 2020-11-24 (×6): qty 1

## 2020-11-24 MED ORDER — MORPHINE SULFATE (PF) 0.5 MG/ML IJ SOLN
INTRAMUSCULAR | Status: DC | PRN
  Administered 2020-11-24: .15 mg via INTRATHECAL

## 2020-11-24 MED ORDER — TETANUS-DIPHTH-ACELL PERTUSSIS 5-2.5-18.5 LF-MCG/0.5 IM SUSY: 0.5000 mL | PREFILLED_SYRINGE | Freq: Once | INTRAMUSCULAR | Status: DC

## 2020-11-24 MED ORDER — PRENATAL MULTIVITAMIN CH
1.0000 | ORAL_TABLET | Freq: Every day | ORAL | Status: DC
  Administered 2020-11-24 – 2020-11-25 (×2): 1 via ORAL
  Filled 2020-11-24 (×2): qty 1

## 2020-11-24 MED ORDER — ONDANSETRON HCL 4 MG/2ML IJ SOLN
INTRAMUSCULAR | Status: DC | PRN
  Administered 2020-11-24 (×2): 4 mg via INTRAVENOUS

## 2020-11-24 MED ORDER — EPHEDRINE 5 MG/ML INJ
INTRAVENOUS | Status: AC
  Filled 2020-11-24: qty 10

## 2020-11-24 MED ORDER — WITCH HAZEL-GLYCERIN EX PADS: 1.0000 "application " | MEDICATED_PAD | CUTANEOUS | Status: DC | PRN

## 2020-11-24 MED ORDER — OXYCODONE HCL 5 MG PO TABS
5.0000 mg | ORAL_TABLET | ORAL | Status: DC | PRN
  Administered 2020-11-25 – 2020-11-26 (×3): 5 mg via ORAL
  Filled 2020-11-24 (×3): qty 1

## 2020-11-24 MED ORDER — DIPHENHYDRAMINE HCL 25 MG PO CAPS: 25.0000 mg | ORAL_CAPSULE | ORAL | Status: DC | PRN

## 2020-11-24 MED ORDER — ONDANSETRON HCL 4 MG/2ML IJ SOLN: 4.0000 mg | Freq: Three times a day (TID) | INTRAMUSCULAR | Status: DC | PRN

## 2020-11-24 MED ORDER — FENTANYL CITRATE (PF) 100 MCG/2ML IJ SOLN
INTRAMUSCULAR | Status: AC
  Filled 2020-11-24: qty 2

## 2020-11-24 MED ORDER — BUPIVACAINE IN DEXTROSE 0.75-8.25 % IT SOLN
INTRATHECAL | Status: DC | PRN
  Administered 2020-11-24: 1.6 mL via INTRATHECAL

## 2020-11-24 MED ORDER — PROMETHAZINE HCL 25 MG/ML IJ SOLN
12.5000 mg | Freq: Four times a day (QID) | INTRAMUSCULAR | Status: DC | PRN
  Filled 2020-11-24: qty 1

## 2020-11-24 MED ORDER — MORPHINE SULFATE (PF) 0.5 MG/ML IJ SOLN
INTRAMUSCULAR | Status: AC
  Filled 2020-11-24: qty 10

## 2020-11-24 MED ORDER — BUTORPHANOL TARTRATE 1 MG/ML IJ SOLN
2.0000 mg | Freq: Once | INTRAMUSCULAR | Status: AC
  Administered 2020-11-24: 2 mg via INTRAVENOUS
  Filled 2020-11-24: qty 2

## 2020-11-24 MED ORDER — FENTANYL CITRATE (PF) 100 MCG/2ML IJ SOLN
50.0000 ug | INTRAMUSCULAR | Status: DC | PRN
  Administered 2020-11-24 (×2): 50 ug via INTRAVENOUS
  Filled 2020-11-24 (×2): qty 2

## 2020-11-24 MED ORDER — OXYTOCIN-SODIUM CHLORIDE 30-0.9 UT/500ML-% IV SOLN: 2.5000 [IU]/h | INTRAVENOUS | Status: AC

## 2020-11-24 MED ORDER — OXYTOCIN-SODIUM CHLORIDE 30-0.9 UT/500ML-% IV SOLN
INTRAVENOUS | Status: AC
  Filled 2020-11-24: qty 500

## 2020-11-24 MED ORDER — CEFAZOLIN SODIUM-DEXTROSE 2-4 GM/100ML-% IV SOLN
2.0000 g | INTRAVENOUS | Status: AC
  Administered 2020-11-24: 2 g via INTRAVENOUS
  Filled 2020-11-24: qty 100

## 2020-11-24 MED ORDER — MEPERIDINE HCL 25 MG/ML IJ SOLN: 6.2500 mg | INTRAMUSCULAR | Status: DC | PRN

## 2020-11-24 MED ORDER — IBUPROFEN 600 MG PO TABS
600.0000 mg | ORAL_TABLET | Freq: Four times a day (QID) | ORAL | Status: DC
  Administered 2020-11-25 – 2020-11-26 (×4): 600 mg via ORAL
  Filled 2020-11-24 (×4): qty 1

## 2020-11-24 MED ORDER — DEXAMETHASONE SODIUM PHOSPHATE 10 MG/ML IJ SOLN
INTRAMUSCULAR | Status: AC
  Filled 2020-11-24: qty 1

## 2020-11-24 MED ORDER — KETOROLAC TROMETHAMINE 30 MG/ML IJ SOLN: 30.0000 mg | Freq: Four times a day (QID) | INTRAMUSCULAR | Status: DC | PRN

## 2020-11-24 MED ORDER — KETOROLAC TROMETHAMINE 30 MG/ML IJ SOLN
INTRAMUSCULAR | Status: DC | PRN
  Administered 2020-11-24: 30 mg via INTRAVENOUS

## 2020-11-24 MED ORDER — KETOROLAC TROMETHAMINE 30 MG/ML IJ SOLN
30.0000 mg | Freq: Four times a day (QID) | INTRAMUSCULAR | Status: AC
  Administered 2020-11-24 – 2020-11-25 (×4): 30 mg via INTRAVENOUS
  Filled 2020-11-24 (×4): qty 1

## 2020-11-24 MED ORDER — ONDANSETRON HCL 4 MG/2ML IJ SOLN
INTRAMUSCULAR | Status: AC
  Filled 2020-11-24: qty 2

## 2020-11-24 MED ORDER — LACTATED RINGERS IV SOLN: INTRAVENOUS | Status: DC | PRN

## 2020-11-24 MED ORDER — LACTATED RINGERS IV BOLUS
1000.0000 mL | Freq: Once | INTRAVENOUS | Status: AC
  Administered 2020-11-24: 1000 mL via INTRAVENOUS

## 2020-11-24 MED ORDER — FENTANYL CITRATE (PF) 100 MCG/2ML IJ SOLN
INTRAMUSCULAR | Status: DC | PRN
  Administered 2020-11-24: 10 ug via INTRATHECAL

## 2020-11-24 MED ORDER — SIMETHICONE 80 MG PO CHEW
80.0000 mg | CHEWABLE_TABLET | ORAL | Status: DC | PRN
  Administered 2020-11-25: 80 mg via ORAL
  Filled 2020-11-24: qty 1

## 2020-11-24 MED ORDER — NALOXONE HCL 0.4 MG/ML IJ SOLN: 0.4000 mg | INTRAMUSCULAR | Status: DC | PRN

## 2020-11-24 MED ORDER — ACETAMINOPHEN 500 MG PO TABS
1000.0000 mg | ORAL_TABLET | Freq: Three times a day (TID) | ORAL | Status: DC
  Administered 2020-11-24 – 2020-11-26 (×7): 1000 mg via ORAL
  Filled 2020-11-24 (×7): qty 2

## 2020-11-24 MED ORDER — OXYTOCIN-SODIUM CHLORIDE 30-0.9 UT/500ML-% IV SOLN
INTRAVENOUS | Status: DC | PRN
  Administered 2020-11-24: 30 [IU] via INTRAVENOUS

## 2020-11-24 MED ORDER — SODIUM CHLORIDE 0.9% FLUSH: 3.0000 mL | INTRAVENOUS | Status: DC | PRN

## 2020-11-24 MED ORDER — DIPHENHYDRAMINE HCL 50 MG/ML IJ SOLN: 12.5000 mg | INTRAMUSCULAR | Status: DC | PRN

## 2020-11-24 MED ORDER — SOD CITRATE-CITRIC ACID 500-334 MG/5ML PO SOLN
30.0000 mL | ORAL | Status: AC
  Administered 2020-11-24: 30 mL via ORAL
  Filled 2020-11-24: qty 15

## 2020-11-24 MED ORDER — MENTHOL 3 MG MT LOZG: 1.0000 | LOZENGE | OROMUCOSAL | Status: DC | PRN

## 2020-11-24 MED ORDER — PHENYLEPHRINE HCL-NACL 20-0.9 MG/250ML-% IV SOLN
INTRAVENOUS | Status: DC | PRN
  Administered 2020-11-24: 60 ug/min via INTRAVENOUS

## 2020-11-24 MED ORDER — EPHEDRINE SULFATE 50 MG/ML IJ SOLN
INTRAMUSCULAR | Status: DC | PRN
  Administered 2020-11-24: 10 mg via INTRAVENOUS

## 2020-11-24 MED ORDER — ENOXAPARIN SODIUM 40 MG/0.4ML IJ SOSY
40.0000 mg | PREFILLED_SYRINGE | INTRAMUSCULAR | Status: DC
  Administered 2020-11-25: 40 mg via SUBCUTANEOUS
  Filled 2020-11-24: qty 0.4

## 2020-11-24 MED ORDER — NALBUPHINE HCL 10 MG/ML IJ SOLN
5.0000 mg | Freq: Once | INTRAMUSCULAR | Status: DC | PRN
Start: 2020-11-24 — End: 2020-11-26

## 2020-11-24 MED ORDER — NALOXONE HCL 4 MG/10ML IJ SOLN
1.0000 ug/kg/h | INTRAVENOUS | Status: DC | PRN
  Filled 2020-11-24: qty 5

## 2020-11-24 MED ORDER — DIBUCAINE (PERIANAL) 1 % EX OINT: 1.0000 "application " | TOPICAL_OINTMENT | CUTANEOUS | Status: DC | PRN

## 2020-11-24 MED ORDER — SENNOSIDES-DOCUSATE SODIUM 8.6-50 MG PO TABS
2.0000 | ORAL_TABLET | ORAL | Status: DC
  Administered 2020-11-25: 2 via ORAL
  Filled 2020-11-24: qty 2

## 2020-11-24 MED ORDER — PHENYLEPHRINE HCL-NACL 20-0.9 MG/250ML-% IV SOLN
INTRAVENOUS | Status: AC
  Filled 2020-11-24: qty 250

## 2020-11-24 SURGICAL SUPPLY — 36 items
BENZOIN TINCTURE PRP APPL 2/3 (GAUZE/BANDAGES/DRESSINGS) ×2 IMPLANT
CHLORAPREP W/TINT 26ML (MISCELLANEOUS) ×2 IMPLANT
CLAMP CORD UMBIL (MISCELLANEOUS) IMPLANT
CLOTH BEACON ORANGE TIMEOUT ST (SAFETY) ×2 IMPLANT
DERMABOND ADVANCED (GAUZE/BANDAGES/DRESSINGS)
DERMABOND ADVANCED .7 DNX12 (GAUZE/BANDAGES/DRESSINGS) IMPLANT
DRSG OPSITE POSTOP 4X10 (GAUZE/BANDAGES/DRESSINGS) ×2 IMPLANT
ELECT REM PT RETURN 9FT ADLT (ELECTROSURGICAL) ×2
ELECTRODE REM PT RTRN 9FT ADLT (ELECTROSURGICAL) ×1 IMPLANT
EXTRACTOR VACUUM KIWI (MISCELLANEOUS) IMPLANT
GLOVE BIOGEL PI IND STRL 7.0 (GLOVE) ×3 IMPLANT
GLOVE BIOGEL PI INDICATOR 7.0 (GLOVE) ×3
GLOVE ECLIPSE 6.5 STRL STRAW (GLOVE) ×2 IMPLANT
GOWN STRL REUS W/TWL LRG LVL3 (GOWN DISPOSABLE) ×6 IMPLANT
KIT ABG SYR 3ML LUER SLIP (SYRINGE) IMPLANT
NEEDLE HYPO 25X5/8 SAFETYGLIDE (NEEDLE) IMPLANT
NS IRRIG 1000ML POUR BTL (IV SOLUTION) ×2 IMPLANT
PACK C SECTION WH (CUSTOM PROCEDURE TRAY) ×2 IMPLANT
PAD ABD 7.5X8 STRL (GAUZE/BANDAGES/DRESSINGS) ×2 IMPLANT
PAD OB MATERNITY 4.3X12.25 (PERSONAL CARE ITEMS) ×2 IMPLANT
PENCIL SMOKE EVAC W/HOLSTER (ELECTROSURGICAL) ×2 IMPLANT
RTRCTR C-SECT PINK 25CM LRG (MISCELLANEOUS) ×2 IMPLANT
STRIP CLOSURE SKIN 1/2X4 (GAUZE/BANDAGES/DRESSINGS) ×2 IMPLANT
STRIP SURGICAL 1 X 6 IN (GAUZE/BANDAGES/DRESSINGS) ×2 IMPLANT
SUT PLAIN 0 NONE (SUTURE) IMPLANT
SUT PLAIN 2 0 XLH (SUTURE) IMPLANT
SUT VIC AB 0 CT1 27 (SUTURE) ×2
SUT VIC AB 0 CT1 27XBRD ANBCTR (SUTURE) ×2 IMPLANT
SUT VIC AB 0 CTX 36 (SUTURE) ×3
SUT VIC AB 0 CTX36XBRD ANBCTRL (SUTURE) ×3 IMPLANT
SUT VIC AB 2-0 CT1 27 (SUTURE) ×1
SUT VIC AB 2-0 CT1 TAPERPNT 27 (SUTURE) ×1 IMPLANT
SUT VIC AB 4-0 KS 27 (SUTURE) ×2 IMPLANT
TOWEL OR 17X24 6PK STRL BLUE (TOWEL DISPOSABLE) ×2 IMPLANT
TRAY FOLEY W/BAG SLVR 14FR LF (SET/KITS/TRAYS/PACK) IMPLANT
WATER STERILE IRR 1000ML POUR (IV SOLUTION) ×2 IMPLANT

## 2020-11-24 NOTE — Anesthesia Procedure Notes (Signed)
Spinal  Patient location during procedure: OR Start time: 11/24/2020 7:15 AM End time: 11/24/2020 7:20 AM Reason for block: surgical anesthesia Staffing Performed: anesthesiologist  Anesthesiologist: Mellody Dance, MD Preanesthetic Checklist Completed: patient identified, IV checked, risks and benefits discussed, surgical consent, monitors and equipment checked, pre-op evaluation and timeout performed Spinal Block Patient position: sitting Prep: DuraPrep Patient monitoring: cardiac monitor, continuous pulse ox and blood pressure Approach: midline Location: L3-4 Injection technique: single-shot Needle Needle type: Pencan  Needle gauge: 24 G Needle length: 9 cm Assessment Events: CSF return Additional Notes Functioning IV was confirmed and monitors were applied. Sterile prep and drape, including hand hygiene and sterile gloves were used. The patient was positioned and the spine was prepped. The skin was anesthetized with lidocaine.  Free flow of clear CSF was obtained prior to injecting local anesthetic into the CSF.  The spinal needle aspirated freely following injection.  The needle was carefully withdrawn.  The patient tolerated the procedure well.

## 2020-11-24 NOTE — H&P (Addendum)
History    CSN: [id number]   Arrival date and time: 11/23/20 2354    None         Chief Complaint  Patient presents with   Contractions    HPI    Patient is a [id number] at [redacted]w[redacted]d who presents again to the MAU for contractions. Patient desires a RCS. Has been having contractions all day and has been to MAU two prior times today. Last seen around 6pm and received IV phenergan and morphine prior to discharge home. Reports sine then the contractions have only become more uncomfortable. She also reports having difficulty tolerating PO and has had episodes of vomiting. Denies leakage of fluid. Good fetal movement. She last ate at 9pm.              OB History     Gravida  3   Para  1   Term  0   Preterm  1   AB  1   Living  1      SAB  1   IAB  0   Ectopic  0   Multiple  0   Live Births  1                   Past Medical History:  Diagnosis Date   Breech presentation 11/07/2019   PONV (postoperative nausea and vomiting)     Seizures (HCC)      last seizure 3 years ago           Past Surgical History:  Procedure Laterality Date   CESAREAN SECTION N/A 11/07/2019    Procedure: CESAREAN SECTION;  Surgeon: Tereso Newcomer, MD;  Location: [facility] LD [facility]S;  Service: Obstetrics;  Laterality: N/A;   CHOLECYSTECTOMY   2018    approx 2018 per pt   KNEE SURGERY Right     NO PAST SURGERIES       WISDOM TOOTH EXTRACTION               Family History  Problem Relation Age of Onset   Cancer Maternal Grandmother     Healthy Mother     Healthy Father        Social History         Tobacco Use   Smoking status: Current Every Day Smoker      Years: 6.00      Types: E-cigarettes   Smokeless tobacco: Never Used  Building services engineer Use: Some days   Substances: Nicotine  Substance Use Topics   Alcohol use: Never   Drug use: Not Currently      Frequency: 4.0 times per week      Types: Marijuana      Comment: last was 3 days ago      Allergies:      Allergies   Allergen Reactions   Codeine Hives             Medications Prior to Admission  Medication Sig Dispense Refill Last Dose   calcium carbonate (TUMS - DOSED IN MG ELEMENTAL CALCIUM) 500 MG chewable tablet Chew 1 tablet by mouth as needed for indigestion or heartburn.         Prenatal Vit-Fe Fumarate-FA (PRENATAL MULTIVITAMIN) TABS tablet Take 1 tablet by mouth daily at 12 noon.            Review of Systems  Constitutional: Negative for activity change, appetite change, chills and fever.  Respiratory: Negative for shortness of breath.  Cardiovascular: Negative for chest pain.  Gastrointestinal: Positive for nausea and vomiting.  Neurological: Negative for dizziness and headaches.    Physical Exam    Blood pressure 121/86, pulse 69, temperature (!) 97.5 F (36.4 C), resp. rate 18, height 5\' 9"  (1.753 m), weight 56.7 kg, last menstrual period 02/25/2020, not currently breastfeeding.   Physical Exam Vitals and nursing note reviewed.  Constitutional:      Appearance: Normal appearance.  Cardiovascular:     Rate and Rhythm: Normal rate.  Pulmonary:     Effort: Pulmonary effort is normal.  Abdominal:     Comments: Contractions palpate moderate, gravid uterus  Skin:    General: Skin is warm and dry.  Neurological:     Mental Status: She is alert.  Psychiatric:        Mood and Affect: Mood normal.        Behavior: Behavior normal.        MAU Course  Procedures   MDM Pt arrives as labor check, exam per RN is 3/90/ballotable.  Reviewed prior exams, previously was 2/60/ballotable Evaluated at bedside    Pt informed that the ultrasound is considered a limited OB ultrasound and is not intended to be a complete ultrasound exam.  Patient also informed that the ultrasound is not being completed with the intent of assessing for fetal or placental anomalies or any pelvic abnormalities.  Explained that the purpose of today's ultrasound is to assess for  presentation.  Patient  acknowledges the purpose of the exam and the limitations of the study.     Infant in footling breech presentation.    NST: baseline 130, mod variability, pos accels, no decels  Toco: q2-3 min    Nubaine, Phenergan, 1L IVF ordered    Patient reevaluated, pain only slightly improved, cervical exam now 4cm. Case discussed with Dr. 04/26/2020, plan to post for rLTCS in AM.  Orders placed, Dr. Alysia Penna to schedule case with OR.    Assessment and Plan    [redacted]weeks gestation, early labor, breech presentation, hx of prior cesarean section  The risks of surgery were discussed with the patient including but were not limited to: bleeding which may require transfusion or reoperation; infection which may require antibiotics; injury to bowel, bladder, ureters or other surrounding organs; injury to the fetus; need for additional procedures including hysterectomy in the event of a life-threatening hemorrhage; formation of adhesions; placental abnormalities with subsequent pregnancies; incisional problems; thromboembolic phenomenon and other postoperative/anesthesia complications.  The patient concurred with the proposed plan, giving informed written consent for the procedure.   Patient has been NPO since 2100 she will remain NPO for procedure. Anesthesia and OR aware. Preoperative prophylactic antibiotics and SCDs ordered on call to the OR.     Case posted for 1045 AM.  Desires inpatient nexplanon for birth control.  Female infant.     OB Attending Pt seen. Has become more uncomfortable with cervical change. Pt's case has been moved up to now.  2101, MD

## 2020-11-24 NOTE — MAU Note (Signed)
Pt was seen here earlier today and last went home earlier this evening after IM pain med. States was unable to sleep and now ctxs are stronger and closer. Denies LOF or VB

## 2020-11-24 NOTE — Anesthesia Preprocedure Evaluation (Addendum)
Anesthesia Evaluation  Patient identified by MRN, date of birth, ID band Patient awake    Reviewed: Allergy & Precautions, NPO status , Patient's Chart, lab work & pertinent test results  History of Anesthesia Complications (+) PONVNegative for: history of anesthetic complications  Airway Mallampati: I  TM Distance: >3 FB Neck ROM: Full    Dental no notable dental hx.    Pulmonary Current Smoker,    Pulmonary exam normal        Cardiovascular negative cardio ROS Normal cardiovascular exam     Neuro/Psych Seizures -,  negative psych ROS   GI/Hepatic negative GI ROS, Neg liver ROS,   Endo/Other  negative endocrine ROS  Renal/GU negative Renal ROS  negative genitourinary   Musculoskeletal negative musculoskeletal ROS (+)   Abdominal Normal abdominal exam  (+)   Peds  Hematology negative hematology ROS (+)   Anesthesia Other Findings  C/S for breech  Reproductive/Obstetrics (+) Pregnancy                           Anesthesia Physical  Anesthesia Plan  ASA: II  Anesthesia Plan: Spinal   Post-op Pain Management:    Induction:   PONV Risk Score and Plan: 2 and Ondansetron and Treatment may vary due to age or medical condition  Airway Management Planned: Natural Airway  Additional Equipment: None  Intra-op Plan:   Post-operative Plan:   Informed Consent: I have reviewed the patients History and Physical, chart, labs and discussed the procedure including the risks, benefits and alternatives for the proposed anesthesia with the patient or authorized representative who has indicated his/her understanding and acceptance.       Plan Discussed with: Anesthesiologist and CRNA  Anesthesia Plan Comments:        Anesthesia Quick Evaluation

## 2020-11-24 NOTE — Transfer of Care (Signed)
Immediate Anesthesia Transfer of Care Note  Patient: Cassandra Thomas  Procedure(s) Performed: CESAREAN SECTION (N/A )  Patient Location: PACU  Anesthesia Type:Spinal  Level of Consciousness: awake, alert  and oriented  Airway & Oxygen Therapy: Patient Spontanous Breathing  Post-op Assessment: Report given to RN and Post -op Vital signs reviewed and stable  Post vital signs: Reviewed and stable  Last Vitals:  Vitals Value Taken Time  BP 96/50 11/24/20 0815  Temp    Pulse 88 11/24/20 0816  Resp 15 11/24/20 0816  SpO2 100 % 11/24/20 0816  Vitals shown include unvalidated device data.  Last Pain:  Vitals:   11/24/20 0520  PainSc: 6       Patients Stated Pain Goal: 0 (11/24/20 0002)  Complications: No complications documented.

## 2020-11-24 NOTE — Op Note (Signed)
Operative Note   SURGERY DATE: 11/24/2020  PRE-OP DIAGNOSIS:  *Pregnancy at [redacted]w[redacted]d * History of one prior cesarean section * Footling Breech Presentation  POST-OP DIAGNOSIS: Same   PROCEDURE: Repeat low transverse cesarean section via pfannenstiel skin incision with double layer uterine closure  SURGEON: Surgeon(s) and Role:    * Hermina Staggers, MD - Primary    * Myriam Jacobson Arlana Pouch, MD - Fellow  ANESTHESIA: spinal  ESTIMATED BLOOD LOSS: 200 mL  DRAINS: UOP via indwelling foley  TOTAL IV FLUIDS: crystalloid  VTE PROPHYLAXIS: SCDs to bilateral lower extremities  ANTIBIOTICS: Two grams of Cefazolin were given., within 1 hour of skin incision  SPECIMENS: placenta to L&D  COMPLICATIONS: none  INDICATIONS: Patient presented in labor with known breech presentation, history of prior cesarean section and requesting repeat cesarean.   FINDINGS: No intra-abdominal adhesions were noted. Grossly normal uterus, tubes and ovaries. Clear amniotic fluid, Footling breech female infant, weight pending, APGARs 7/9, intact placenta.  PROCEDURE IN DETAIL: The patient was taken to the operating room where anesthesia was administered and normal fetal heart tones were confirmed. She was then prepped and draped in the normal fashion in the dorsal supine position with a leftward tilt.  After a time out was performed, a pfannensteil skin incision was made with the scalpel and carried through to the underlying layer of fascia. The fascia was then incised at the midline and this incision was extended laterally with the mayo scissors. Attention was turned to the superior aspect of the fascial incision which was grasped with the kocher clamps x 2, tented up and the rectus muscles were dissected off with the mayo scissors. In a similar fashion the inferior aspect of the fascial incision was grasped with the kocher clamps, tented up and the rectus muscles dissected off with the mayo scissors. The rectus  muscles were then separated in the midline and the peritoneum was entered bluntly. The Alexis retractor was inserted.   A low transverse hysterotomy was made with the scalpel until the endometrial cavity was breached and the amniotic sac ruptured, yielding clear amniotic fluid. This incision was extended bluntly and the infant's head, shoulders and body were delivered atraumatically from breech presentation.The cord was clamped x 2 and cut, and the infant was handed to the awaiting pediatricians, after delayed cord clamping was done.  The placenta was then gradually expressed from the uterus and then the uterus was exteriorized and cleared of all clots and debris. The hysterotomy was repaired with a running suture of 0 chromic. A second imbricating layer of 0 chromic suture was then placed. Several figure-of-eight sutures of 0 chromic were added to achieve excellent hemostasis.    The uterus and adnexa were then returned to the abdomen, and the hysterotomy and all operative sites were reinspected and excellent hemostasis was noted after irrigation and suction of the abdomen with warm saline.  The peritoneum was closed with a running stitch of 2-0 Vicryl. The fascia was reapproximated with 0 Vicryl in a simple running fashion bilaterally. The subcutaneous layer was then reapproximated with interrupted sutures of 2-0 plain gut, and the skin was then closed with 4-0 vicryl, in a subcuticular fashion.  The patient  tolerated the procedure well. Sponge, lap, needle, and instrument counts were correct x 2. The patient was transferred to the recovery room awake, alert and breathing independently in stable condition.   Casper Harrison, MD [facility] Fellow, [facility] for [employer], [facility]

## 2020-11-24 NOTE — MAU Provider Note (Signed)
History     CSN: [id number]  Arrival date and time: 11/23/20 2354   None     Chief Complaint  Patient presents with  . Contractions   HPI   Patient is a [id number] at [redacted]w[redacted]d who presents again to the MAU for contractions. Patient desires a RCS. Has been having contractions all day and has been to MAU two prior times today. Last seen around 6pm and received IV phenergan and morphine prior to discharge home. Reports sine then the contractions have only become more uncomfortable. She also reports having difficulty tolerating PO and has had episodes of vomiting. Denies leakage of fluid. Good fetal movement. She last ate at 9pm.    OB History    Gravida  3   Para  1   Term  0   Preterm  1   AB  1   Living  1     SAB  1   IAB  0   Ectopic  0   Multiple  0   Live Births  1           Past Medical History:  Diagnosis Date  . Breech presentation 11/07/2019  . PONV (postoperative nausea and vomiting)   . Seizures (HCC)    last seizure 3 years ago    Past Surgical History:  Procedure Laterality Date  . CESAREAN SECTION N/A 11/07/2019   Procedure: CESAREAN SECTION;  Surgeon: Tereso Newcomer, MD;  Location: [facility] LD ORS;  Service: Obstetrics;  Laterality: N/A;  . CHOLECYSTECTOMY  2018   approx 2018 per pt  . KNEE SURGERY Right   . NO PAST SURGERIES    . WISDOM TOOTH EXTRACTION      Family History  Problem Relation Age of Onset  . Cancer Maternal Grandmother   . Healthy Mother   . Healthy Father     Social History   Tobacco Use  . Smoking status: Current Every Day Smoker    Years: 6.00    Types: E-cigarettes  . Smokeless tobacco: Never Used  Vaping Use  . Vaping Use: Some days  . Substances: Nicotine  Substance Use Topics  . Alcohol use: Never  . Drug use: Not Currently    Frequency: 4.0 times per week    Types: Marijuana    Comment: last was 3 days ago    Allergies:  Allergies  Allergen Reactions  . Codeine Hives    Medications Prior to  Admission  Medication Sig Dispense Refill Last Dose  . calcium carbonate (TUMS - DOSED IN MG ELEMENTAL CALCIUM) 500 MG chewable tablet Chew 1 tablet by mouth as needed for indigestion or heartburn.     . Prenatal Vit-Fe Fumarate-FA (PRENATAL MULTIVITAMIN) TABS tablet Take 1 tablet by mouth daily at 12 noon.       Review of Systems  Constitutional: Negative for activity change, appetite change, chills and fever.  Respiratory: Negative for shortness of breath.   Cardiovascular: Negative for chest pain.  Gastrointestinal: Positive for nausea and vomiting.  Neurological: Negative for dizziness and headaches.   Physical Exam   Blood pressure 121/86, pulse 69, temperature (!) 97.5 F (36.4 C), resp. rate 18, height 5\' 9"  (1.753 m), weight 56.7 kg, last menstrual period 02/25/2020, not currently breastfeeding.  Physical Exam Vitals and nursing note reviewed.  Constitutional:      Appearance: Normal appearance.  Cardiovascular:     Rate and Rhythm: Normal rate.  Pulmonary:     Effort: Pulmonary effort is normal.  Abdominal:     Comments: Contractions palpate moderate, gravid uterus  Skin:    General: Skin is warm and dry.  Neurological:     Mental Status: She is alert.  Psychiatric:        Mood and Affect: Mood normal.        Behavior: Behavior normal.     MAU Course  Procedures  MDM Pt arrives as labor check, exam per RN is 3/90/ballotable.  Reviewed prior exams, previously was 2/60/ballotable Evaluated at bedside   Pt informed that the ultrasound is considered a limited OB ultrasound and is not intended to be a complete ultrasound exam.  Patient also informed that the ultrasound is not being completed with the intent of assessing for fetal or placental anomalies or any pelvic abnormalities.  Explained that the purpose of today's ultrasound is to assess for  presentation.  Patient acknowledges the purpose of the exam and the limitations of the study.    Infant in footling  breech presentation.    NST: baseline 130, mod variability, pos accels, no decels  Toco: q2-3 min   Nubaine, Phenergan, 1L IVF ordered   Patient reevaluated, pain only slightly improved, cervical exam now 4cm. Case discussed with Dr. Alysia Penna, plan to post for rLTCS in AM.  Orders placed, Dr. Alysia Penna to schedule case with OR.   Assessment and Plan   [redacted]weeks gestation, early labor, breech presentation, hx of prior cesarean section -plan for OR in morning.   Gita Kudo 11/24/2020, 12:43 AM

## 2020-11-24 NOTE — Discharge Summary (Addendum)
   Postpartum Discharge Summary  Date of Service updated 11/26/2020     Patient Name: Cassandra Thomas DOB: 05/13/1991 MRN: [id number]  Date of admission: 11/23/2020 Delivery date:11/24/2020  Delivering provider: ERVIN, MICHAEL L  Date of discharge: 11/26/2020  Admitting diagnosis: Cesarean delivery delivered [O82] Intrauterine pregnancy: [redacted]w[redacted]d     Secondary diagnosis:  Active Problems:   Cesarean delivery delivered  Additional problems: none    Discharge diagnosis: Term Pregnancy Delivered                                              Post partum procedures:none Augmentation: N/A Complications: None  Hospital course: Onset of Labor With Unplanned C/S   30 y.o. yo G3P1112 at [redacted]w[redacted]d was admitted in Latent Labor on 11/23/2020. Patient had a labor course significant for presenting in early labor and then progressing to 6 cm. The patient went for cesarean section due to Elective Repeat and Malpresentation. Delivery details as follows: Membrane Rupture Time/Date: 7:32 AM ,11/24/2020   Delivery Method:C-Section, Low Transverse  Details of operation can be found in separate operative note. Patient had an uncomplicated postpartum course.  She is ambulating,tolerating a regular diet, passing flatus, and urinating well.  Patient is discharged home in stable condition 11/26/20.  Newborn Data: Birth date:11/24/2020  Birth time:7:34 AM  Gender:Female  Living status:Living  Apgars:7 ,9  Weight:2736 g   Magnesium Sulfate received: No BMZ received: No Rhophylac:N/A MMR:N/A T-DaP:Given prenatally Flu: Yes Transfusion:No  Physical exam  Vitals:   11/24/20 2045 11/25/20 0020 11/25/20 0511 11/25/20 1400  BP:  122/85 129/78 102/72  Pulse:  (!) 51 61 (!) 50  Resp:  18 18 18  Temp:  98 F (36.7 C) 97.7 F (36.5 C) 98.2 F (36.8 C)  TempSrc:  Axillary Oral Oral  SpO2: 100% 100% 99%   Weight:      Height:       General: alert, cooperative and no distress Lochia: appropriate Uterine  Fundus: firm Incision: Healing well with no significant drainage, No significant erythema, Dressing is clean, dry, and intact DVT Evaluation: No evidence of DVT seen on physical exam. Labs: Lab Results  Component Value Date   WBC 15.0 (H) 11/25/2020   HGB 11.6 (L) 11/25/2020   HCT 33.6 (L) 11/25/2020   MCV 92.3 11/25/2020   PLT 134 (L) 11/25/2020   CMP Latest Ref Rng & Units 11/24/2020  Creatinine 0.44 - 1.00 mg/dL 0.63   Edinburgh Score: Edinburgh Postnatal Depression Scale Screening Tool 11/24/2020  I have been able to laugh and see the funny side of things. 0  I have looked forward with enjoyment to things. 0  I have blamed myself unnecessarily when things went wrong. 0  I have been anxious or worried for no good reason. 1  I have felt scared or panicky for no good reason. 0  Things have been getting on top of me. 1  I have been so unhappy that I have had difficulty sleeping. 0  I have felt sad or miserable. 1  I have been so unhappy that I have been crying. 1  The thought of harming myself has occurred to me. 0  Edinburgh Postnatal Depression Scale Total 4     After visit meds:  Allergies as of 11/26/2020      Reactions   Codeine Hives      Medication   List    TAKE these medications   acetaminophen 500 MG tablet Commonly known as: TYLENOL Take 2 tablets (1,000 mg total) by mouth every 8 (eight) hours.   calcium carbonate 500 MG chewable tablet Commonly known as: TUMS - dosed in mg elemental calcium Chew 1 tablet by mouth as needed for indigestion or heartburn.   ibuprofen 600 MG tablet Commonly known as: ADVIL Take 1 tablet (600 mg total) by mouth every 6 (six) hours.   oxyCODONE 5 MG immediate release tablet Commonly known as: Oxy IR/ROXICODONE Take 1-2 tablets (5-10 mg total) by mouth every 4 (four) hours as needed for moderate pain.   prenatal multivitamin Tabs tablet Take 1 tablet by mouth daily at 12 noon.   senna-docusate 8.6-50 MG tablet Commonly known  as: Senokot-S Take 2 tablets by mouth daily.        Discharge home in stable condition Infant Feeding: Bottle and Breast Infant Disposition:home with mother Discharge instruction: per After Visit Summary and Postpartum booklet. Activity: Advance as tolerated. Pelvic rest for 6 weeks.  Diet: routine diet Future Appointments: No future appointments. Follow up Visit:   Please schedule this patient for a In person postpartum visit in 4 weeks with the following provider: Any provider. Additional Postpartum F/U:Incision check 1 week  Low risk pregnancy complicated by: hx of prior c section Delivery mode:  C-Section, Low Transverse  Anticipated Birth Control:  Nexplanon   11/26/2020 Julia M Marsala, MD  I saw and evaluated the patient. I agree with the findings and the plan of care as documented in the resident's note.  Julia Marsala, MD OB Family Medicine Fellow, Faculty Practice [facility], [facility]   

## 2020-11-24 NOTE — Anesthesia Postprocedure Evaluation (Signed)
Anesthesia Post Note  Patient: Cassandra Thomas  Procedure(s) Performed: CESAREAN SECTION (N/A )     Patient location during evaluation: PACU Anesthesia Type: Spinal Level of consciousness: oriented and awake and alert Pain management: pain level controlled Vital Signs Assessment: post-procedure vital signs reviewed and stable Respiratory status: spontaneous breathing and respiratory function stable Cardiovascular status: blood pressure returned to baseline and stable Postop Assessment: no headache, no backache, no apparent nausea or vomiting and able to ambulate Anesthetic complications: no   No complications documented.  Last Vitals:  Vitals:   11/24/20 0914 11/24/20 0921  BP: 109/64 101/66  Pulse: (!) 58 (!) 52  Resp: (!) 21 18  Temp: 36.6 C 36.7 C  SpO2: 97% 100%    Last Pain:  Vitals:   11/24/20 0914  TempSrc:   PainSc: 0-No pain   Pain Goal: Patients Stated Pain Goal: 0 (11/24/20 0002)                 Mellody Dance

## 2020-11-25 DIAGNOSIS — Z30017 Encounter for initial prescription of implantable subdermal contraceptive: Secondary | ICD-10-CM

## 2020-11-25 LAB — CBC
HCT: 33.6 % — ABNORMAL LOW (ref 36.0–46.0)
Hemoglobin: 11.6 g/dL — ABNORMAL LOW (ref 12.0–15.0)
MCH: 31.9 pg (ref 26.0–34.0)
MCHC: 34.5 g/dL (ref 30.0–36.0)
MCV: 92.3 fL (ref 80.0–100.0)
Platelets: 134 10*3/uL — ABNORMAL LOW (ref 150–400)
RBC: 3.64 MIL/uL — ABNORMAL LOW (ref 3.87–5.11)
RDW: 12.7 % (ref 11.5–15.5)
WBC: 15 10*3/uL — ABNORMAL HIGH (ref 4.0–10.5)
nRBC: 0 % (ref 0.0–0.2)

## 2020-11-25 MED ORDER — ETONOGESTREL 68 MG ~~LOC~~ IMPL
68.0000 mg | DRUG_IMPLANT | Freq: Once | SUBCUTANEOUS | Status: AC
  Administered 2020-11-25: 68 mg via SUBCUTANEOUS
  Filled 2020-11-25: qty 1

## 2020-11-25 MED ORDER — LIDOCAINE HCL 1 % IJ SOLN: 0.0000 mL | Freq: Once | INTRAMUSCULAR | Status: AC | PRN

## 2020-11-25 MED ORDER — LIDOCAINE HCL 1 % IJ SOLN
INTRAMUSCULAR | Status: AC
  Administered 2020-11-25: 20 mL via INTRADERMAL
  Filled 2020-11-25: qty 20

## 2020-11-25 NOTE — Progress Notes (Signed)
POSTPARTUM PROGRESS NOTE  Subjective: Cassandra Thomas is a 30 y.o. [phone] s/p rLTCS at [redacted]w[redacted]d.  She reports she doing well. No acute events overnight. She denies any problems with ambulating, voiding or po intake. Denies nausea or vomiting. She has passed flatus. Pain is well controlled.  Lochia is appropriate.  Objective: Blood pressure 129/78, pulse 61, temperature 97.7 F (36.5 C), temperature source Oral, resp. rate 18, height 5\' 9"  (1.753 m), weight 56.7 kg, last menstrual period 02/25/2020, SpO2 99 %, unknown if currently breastfeeding.  Physical Exam:  General: alert, cooperative and no distress Chest: no respiratory distress. Abdomen: soft, non-tender.  Uterine Fundus: firm and at level of umbilicus. Extremities: No calf swelling or tenderness  no edema. Skin: warm and dry; honeycomb dressing intact.   Recent Labs    11/24/20 0159 11/25/20 0445  HGB 12.4 11.6*  HCT 36.3 33.6*    Assessment/Plan: Cassandra Thomas is a 30 y.o. 26 s/p rLTCS at [redacted]w[redacted]d for breech presentation.  Routine Postpartum Care:  -- Doing well, pain well-controlled.  -- Continue routine care, lactation support.  -- Contraception: Nexplanon -- Feeding: Breast and bottle feeding -- Anemia due to blood loss: hemoglobin is 11.6 g/dL this morning. This is has trended down from 12.6 yesterday. This is expected following LTCS. Will continue to monitor.  Dispo: Plan for discharge POD# 2-3.  [redacted]w[redacted]d, Student-PA 11/25/2020 9:00 AM

## 2020-11-25 NOTE — Social Work (Addendum)
CLINICAL SOCIAL WORK MATERNAL/CHILD NOTE  Patient Details  Name: Cassandra Thomas MRN: [id number] Date of Birth: 11/24/2020  Date:  11/25/2020  Clinical Social Worker Initiating Note:  Kathrin Greathouse, [place] Date/Time: Initiated:  11/25/20/1039     Child's Name:  Cassandra Thomas   Biological Parents:  Father,Mother   Need for Interpreter:  None   Reason for Referral:  Current Substance Use/Substance Use During Pregnancy    Address:  [address] [place] [address]    Phone number:  [phone] (home)     Additional phone number:   Household Members/Support Persons (HM/SP):   Household Member/Support Person 1,Household Member/Support Person 2   HM/SP Name Relationship DOB or Age  HM/SP -1 Arlyne Brandes Spouse 06-05-88  HM/SP -2 Skylar Danish Child 11-07-2019  HM/SP -3        HM/SP -4        HM/SP -5        HM/SP -6        HM/SP -7        HM/SP -8          Natural Supports (not living in the home):  Extended Family   Professional Supports:     Employment: Unemployed   Type of Work:     Education:  Public librarian arranged:    Museum/gallery curator Resources:  Multimedia programmer   Other Resources:      Cultural/Religious Considerations Which May Impact Care:    Strengths:  Ability to meet basic needs ,Pediatrician chosen   Psychotropic Medications:         Pediatrician:    Solicitor area  Lexicographer List:   [employer] [place]      Pediatrician Fax Number:    Risk Factors/Current Problems:  Substance Use    Cognitive State:  Insightful ,Alert ,Linear Thinking ,Able to Concentrate    Mood/Affect:  Calm ,Bright ,Relaxed    CSW Assessment: CSW received consult for substance exposed newborn.  CSW met with MOB to offer support and complete assessment.    CSW met with MOB at bedside. CSW observed MOB holding and  bonding with the infant. CSW congratulated MOB and explain CSW role. MOB presented calm and receptive to [place] visit. CSW confirmed MOB demographic information and household members. MOB confirmed demographics correct and says she live in the home with her spouse and older daughter (See chart above). CSW inquired how MOB has felt since giving birth. MOB reports feeling tired but good. CSW inquired about MOB supports. MOB acknowledges her spouse, mother-in-law, parents, friends and siblings and supports.   CSW inquired about MOB history of substance use. 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 MOB reports the last time she used was about a week ago for nausea and to help relieve pregnancy pain. MOB reports the THC did not help. MOB reports she used 8-9 times total during the pregnancy. CSW notified MOB of the hospital drug screen policy and that CSW will follow UDS, CDS and make a report to CPS, if warranted. CSW inquired if MOB has CPS history. MOB reports CPS was notified of her older child's positive UDS. MOB reports CPS came out and complete an assessment. MOB reports the case is closed. MOB inquired if CPS would  take the baby. CSW noticed MOB that CPS will complete their own assessment and develop a plan with her. MOB reports understanding.   CSW inquired if MOB has mental health history. MOB denies mental health history. MOB reports only normal feeling of sadness. CSW inquired if MOB experienced postpartum depression with her older child. MOB reports she did not experience postpartum depression. CSW provided education regarding the baby blues period vs. perinatal mood disorders, discussed treatment and gave resources for mental health follow up if concerns arise. CSW recommended self-evaluation during the postpartum time period using the New Mom Checklist from Postpartum Progress and encouraged MOB to contact a medical professional if  symptoms are noted at any time. MOB was receptive to the resources provided. CSW assessed MOB for safety. MOB reports no thoughts of harm to self and others.   CSW provided review of Sudden Infant Death Syndrome (SIDS) precautions and informed MOB no co-sleeping with the infant. MOB reports understanding and that the infant will sleep in a bassinet. MOB reports she has essential items for the infant including a car seat and stroller at bedside. MOB has chosen [employer] and has no transportation concerns.   -Infant UDS positive for THC and opioids. Per physician note, MOB was prescribed  morphine prior to delivery when MOB presented initially with pain and was discharge home.   -CSW to make a report to CPS.   CSW identifies no further need for intervention and no barriers to discharge at this time.   CSW Plan/Description:  Child Protective Service Report ,No Further Intervention Required/No Barriers to [place] Infant Death Syndrome (SIDS) Education,CSW Will Continue to Monitor Umbilical Cord Tissue Drug Screen Results and Make Report if [facility] Mood and Anxiety Disorder (PMADs) Education    Lia Hopping, LCSW 11/25/2020, 10:47 AM

## 2020-11-25 NOTE — Procedures (Signed)
Post-Placental Nexplanon Insertion Procedure Note  Patient was identified. Informed consent was signed, signed copy in chart. A time-out was performed.    The insertion site was identified 8-10 cm (3-4 inches) from the medial epicondyle of the humerus and 3-5 cm (1.25-2 inches) posterior to (below) the sulcus (groove) between the biceps and triceps muscles of the patient's left arm and marked. The site was prepped and draped in the usual sterile fashion. Pt was prepped with alcohol swab and then injected with 5 cc of 5% lidocaine.  The site was prepped with betadine. Nexplanon removed form packaging,  Device confirmed in needle, then inserted full length of needle and withdrawn per handbook instructions. Provider and patient verified presence of the implant in the woman's arm by palpation. Pt insertion site was covered with steristrips/adhesive bandage and pressure bandage. There was minimal blood loss. Patient tolerated procedure well.  Patient was given post procedure instructions and Nexplanon user card with expiration date. Condoms were recommended for STI prevention. Patient was asked to keep the pressure dressing on for 24 hours to minimize bruising and keep the adhesive bandage on for 3-5 days. The patient verbalized understanding of the plan of care and agrees.   Lot # [id number] Expiration Date: 2024 July 01  Barb Merino Skipper Cliche, MD [facility] Fellow, Faculty Practice 11/25/2020 4:19 PM

## 2020-11-26 LAB — RPR: RPR Ser Ql: NONREACTIVE

## 2020-11-26 MED ORDER — OXYCODONE HCL 5 MG PO TABS: 5.0000 mg | ORAL_TABLET | ORAL | 0 refills | Status: AC | PRN

## 2020-11-26 MED ORDER — SENNOSIDES-DOCUSATE SODIUM 8.6-50 MG PO TABS: 2.0000 | ORAL_TABLET | ORAL | 0 refills | Status: DC

## 2020-11-26 MED ORDER — ACETAMINOPHEN 500 MG PO TABS: 1000.0000 mg | ORAL_TABLET | Freq: Three times a day (TID) | ORAL | 0 refills | Status: AC

## 2020-11-26 MED ORDER — IBUPROFEN 600 MG PO TABS: 600.0000 mg | ORAL_TABLET | Freq: Four times a day (QID) | ORAL | 0 refills | Status: AC

## 2020-11-28 ENCOUNTER — Encounter: Payer: 59 | Admitting: Obstetrics and Gynecology

## 2020-12-01 ENCOUNTER — Other Ambulatory Visit: Payer: Self-pay

## 2020-12-01 ENCOUNTER — Ambulatory Visit (INDEPENDENT_AMBULATORY_CARE_PROVIDER_SITE_OTHER): Payer: 59

## 2020-12-01 NOTE — Progress Notes (Signed)
Patient was assessed and managed by nursing staff during this encounter. I have reviewed the chart and agree with the documentation and plan. I have also made any necessary editorial changes.  Ananya Mccleese A Deosha Werden, MD 12/01/2020 4:56 PM   

## 2020-12-01 NOTE — Progress Notes (Signed)
Pt here today for wound check. Pt had cesarean section on 11/24/20. Pt denies any bleeding, drainage or pain.    Incision today clean, dry and intact.    Removed honeycomb dressing and steri strips today at visit. Pt tolerated well.    Pt advised to keep incision clean, dry and intact. Advised to call with any concerns or questions. Pt has PP visit on 7/7 with CNM. Pt aware of appt. Pt verbalized understanding.   Judeth Cornfield, RN

## 2020-12-05 ENCOUNTER — Other Ambulatory Visit (HOSPITAL_COMMUNITY): Payer: 59

## 2020-12-05 ENCOUNTER — Encounter (HOSPITAL_COMMUNITY): Admission: RE | Admit: 2020-12-05 | Payer: 59 | Source: Ambulatory Visit

## 2020-12-06 ENCOUNTER — Encounter (HOSPITAL_COMMUNITY): Payer: Self-pay | Admitting: Anesthesiology

## 2020-12-06 NOTE — Anesthesia Preprocedure Evaluation (Deleted)
Anesthesia Evaluation    Reviewed: Allergy & Precautions, Patient's Chart, lab work & pertinent test results  History of Anesthesia Complications (+) PONV and history of anesthetic complications  Airway        Dental   Pulmonary neg pulmonary ROS, Current Smoker,           Cardiovascular negative cardio ROS       Neuro/Psych Seizures -, Well Controlled,  negative psych ROS   GI/Hepatic negative GI ROS, Neg liver ROS,   Endo/Other  negative endocrine ROS  Renal/GU negative Renal ROS  negative genitourinary   Musculoskeletal negative musculoskeletal ROS (+)   Abdominal   Peds  Hematology negative hematology ROS (+)   Anesthesia Other Findings   Reproductive/Obstetrics (+) Pregnancy                             Anesthesia Physical Anesthesia Plan Anesthesia Quick Evaluation

## 2020-12-07 ENCOUNTER — Inpatient Hospital Stay (HOSPITAL_COMMUNITY): Admit: 2020-12-07 | Payer: 59 | Admitting: Obstetrics and Gynecology

## 2020-12-07 ENCOUNTER — Encounter (HOSPITAL_COMMUNITY): Payer: Self-pay

## 2020-12-07 SURGERY — Surgical Case
Anesthesia: Regional

## 2020-12-29 ENCOUNTER — Ambulatory Visit: Payer: 59

## 2021-01-02 ENCOUNTER — Ambulatory Visit (INDEPENDENT_AMBULATORY_CARE_PROVIDER_SITE_OTHER): Payer: 59 | Admitting: Medical

## 2021-01-02 ENCOUNTER — Other Ambulatory Visit (HOSPITAL_COMMUNITY)
Admission: RE | Admit: 2021-01-02 | Discharge: 2021-01-02 | Disposition: A | Discharge status: Home / Self Care | Payer: 59 | Source: Ambulatory Visit

## 2021-01-02 ENCOUNTER — Other Ambulatory Visit: Payer: Self-pay

## 2021-01-02 ENCOUNTER — Encounter: Payer: Self-pay | Admitting: Medical

## 2021-01-02 DIAGNOSIS — Z124 Encounter for screening for malignant neoplasm of cervix: Secondary | ICD-10-CM | POA: Diagnosis present

## 2021-01-02 NOTE — Progress Notes (Signed)
Post Partum Visit Note  Cassandra Thomas is a 30 y.o. [phone] female who presents for a postpartum visit. She is 5 weeks postpartum following a repeat cesarean section.  I have fully reviewed the prenatal and intrapartum course. The delivery was at 37.1 gestational weeks by elective repeat C/S due to breech presentation.  Anesthesia: spinal. Postpartum course has been uncomplicated. Baby is doing well. Baby is feeding by both breast and bottle - [employer] Advantage . Bleeding no bleeding. Bowel function is normal. Bladder function is normal. Patient is not sexually active. Contraception method is Nexplanon. Postpartum depression screening: negative.   The pregnancy intention screening data noted above was reviewed. Potential methods of contraception were discussed. The patient elected to proceed with No data recorded.   Edinburgh Postnatal Depression Scale - 01/02/21 1600       Edinburgh Postnatal Depression Scale:  In the Past 7 Days   I have been able to laugh and see the funny side of things. 0    I have looked forward with enjoyment to things. 0    I have blamed myself unnecessarily when things went wrong. 0    I have been anxious or worried for no good reason. 0    I have felt scared or panicky for no good reason. 0    Things have been getting on top of me. 0    I have been so unhappy that I have had difficulty sleeping. 0    I have felt sad or miserable. 0    I have been so unhappy that I have been crying. 0    The thought of harming myself has occurred to me. 0    Edinburgh Postnatal Depression Scale Total 0             Health Maintenance Due  Topic Date Due   Pneumococcal Vaccine 90-19 Years old (1 - PCV) Never done   COVID-19 Vaccine (3 - Pfizer risk series) 12/02/2019    The following portions of the patient's history were reviewed and updated as appropriate: allergies, current medications, past family history, past medical history, past social history,  past surgical history, and problem list.  Review of Systems Pertinent items are noted in HPI.  Objective:  BP 110/73   Pulse 95   Wt 115 lb 6.4 oz (52.3 kg)   LMP 02/25/2020 (Approximate)   Breastfeeding Yes   BMI 17.04 kg/m    General:  alert and cooperative   Breasts:  not indicated  Lungs: clear to auscultation bilaterally  Heart:  regular rate and rhythm, S1, S2 normal, no murmur, click, rub or gallop  Abdomen: soft, non-tender; bowel sounds normal; no masses,  no organomegaly   Wound well approximated incision, no erythema, drainage or signs of infection  GU exam:  normal no bleeding following pap smear. Scant normal discharge noted.        Assessment:   Normal postpartum exam.  S/P Cesarean Section   Plan:   Essential components of care per ACOG recommendations:  1.  Mood and well being: Patient with negative depression screening today. Reviewed local resources for support.  - Patient tobacco use? Yes. Patient desires to quit? Yes.Discussed reduction and cessation  - hx of drug use? No.    2. Infant care and feeding:  -Patient currently breastmilk feeding? Yes. Reviewed importance of draining breast regularly to support lactation.  -Social determinants of health (SDOH) reviewed in EPIC. No concerns  3. Sexuality, contraception and birth  spacing - Patient does not want a pregnancy in the next year.  Desired family size is 3 children.  - Reviewed forms of contraception in tiered fashion. Patient has  Nexplanon placed inpatient .   - Discussed birth spacing of 18 months  4. Sleep and fatigue -Encouraged family/partner/community support of 4 hrs of uninterrupted sleep to help with mood and fatigue  5. Physical Recovery  - Discussed patients delivery and complications. She describes her labor as good. - Patient had a C-section repeat; no problems after deliver. Patient had a  no  laceration. Perineal healing reviewed. Patient expressed understanding - Patient has  urinary incontinence? No. - Patient is not safe to resume physical and sexual activity. Encouraged to wait until closer to 8 weeks post-op with slow return to activity   6.  Health Maintenance - HM due items addressed Yes - Last pap smear 06/11/2019 was normal without endocervical cells present.  Pap smear done at today's visit.  -Breast Cancer screening indicated? No.   7. Chronic Disease/Pregnancy Condition follow up: None   Vonzella Nipple, PA-C [facility] [employer], [facility]

## 2021-01-09 LAB — CYTOLOGY - PAP
Comment: NEGATIVE
Diagnosis: NEGATIVE
High risk HPV: NEGATIVE

## 2021-03-15 ENCOUNTER — Other Ambulatory Visit: Payer: Self-pay

## 2021-03-15 ENCOUNTER — Emergency Department (HOSPITAL_COMMUNITY)
Admission: EM | Admit: 2021-03-15 | Discharge: 2021-03-15 | Disposition: A | Discharge status: Home / Self Care | Payer: 59 | Attending: Emergency Medicine | Admitting: Emergency Medicine

## 2021-03-15 ENCOUNTER — Encounter (HOSPITAL_COMMUNITY): Payer: Self-pay | Admitting: Emergency Medicine

## 2021-03-15 DIAGNOSIS — R109 Unspecified abdominal pain: Secondary | ICD-10-CM | POA: Diagnosis not present

## 2021-03-15 DIAGNOSIS — E876 Hypokalemia: Secondary | ICD-10-CM | POA: Diagnosis not present

## 2021-03-15 DIAGNOSIS — R112 Nausea with vomiting, unspecified: Secondary | ICD-10-CM | POA: Diagnosis not present

## 2021-03-15 DIAGNOSIS — N9489 Other specified conditions associated with female genital organs and menstrual cycle: Secondary | ICD-10-CM | POA: Diagnosis not present

## 2021-03-15 DIAGNOSIS — N938 Other specified abnormal uterine and vaginal bleeding: Secondary | ICD-10-CM | POA: Insufficient documentation

## 2021-03-15 DIAGNOSIS — Z5321 Procedure and treatment not carried out due to patient leaving prior to being seen by health care provider: Secondary | ICD-10-CM | POA: Insufficient documentation

## 2021-03-15 LAB — URINALYSIS, MICROSCOPIC (REFLEX): Bacteria, UA: NONE SEEN

## 2021-03-15 LAB — URINALYSIS, ROUTINE W REFLEX MICROSCOPIC
Glucose, UA: NEGATIVE mg/dL
Hgb urine dipstick: NEGATIVE
Ketones, ur: >80 mg/dL — AB
Leukocytes,Ua: NEGATIVE
Nitrite: NEGATIVE
Protein, ur: 30 mg/dL — AB
Specific Gravity, Urine: >1.03 — ABNORMAL HIGH (ref 1.005–1.030)
pH: 6 (ref 5.0–8.0)

## 2021-03-15 LAB — COMPREHENSIVE METABOLIC PANEL
ALT: 28 U/L (ref 0–44)
AST: 38 U/L (ref 15–41)
Albumin: 4.4 g/dL (ref 3.5–5.0)
Alkaline Phosphatase: 106 U/L (ref 38–126)
Anion gap: 18 — ABNORMAL HIGH (ref 5–15)
BUN: 6 mg/dL (ref 6–20)
CO2: 19 mmol/L — ABNORMAL LOW (ref 22–32)
Calcium: 9.4 mg/dL (ref 8.9–10.3)
Chloride: 99 mmol/L (ref 98–111)
Creatinine, Ser: 0.85 mg/dL (ref 0.44–1.00)
GFR, Estimated: >60 mL/min (ref >60)
Glucose, Bld: 68 mg/dL — ABNORMAL LOW (ref 70–99)
Potassium: 3.8 mmol/L (ref 3.5–5.1)
Sodium: 136 mmol/L (ref 135–145)
Total Bilirubin: 0.9 mg/dL (ref 0.3–1.2)
Total Protein: 7.9 g/dL (ref 6.5–8.1)

## 2021-03-15 LAB — CBC
HCT: 48.4 % — ABNORMAL HIGH (ref 36.0–46.0)
Hemoglobin: 15.7 g/dL — ABNORMAL HIGH (ref 12.0–15.0)
MCH: 30.8 pg (ref 26.0–34.0)
MCHC: 32.4 g/dL (ref 30.0–36.0)
MCV: 95.1 fL (ref 80.0–100.0)
Platelets: 251 10*3/uL (ref 150–400)
RBC: 5.09 MIL/uL (ref 3.87–5.11)
RDW: 13.2 % (ref 11.5–15.5)
WBC: 13.7 10*3/uL — ABNORMAL HIGH (ref 4.0–10.5)
nRBC: 0 % (ref 0.0–0.2)

## 2021-03-15 LAB — I-STAT BETA HCG BLOOD, ED (MC, WL, AP ONLY): I-stat hCG, quantitative: <5 m[IU]/mL (ref <5)

## 2021-03-15 LAB — LIPASE, BLOOD: Lipase: 21 U/L (ref 11–51)

## 2021-03-15 NOTE — ED Notes (Signed)
Patient stated she was going to step outside for a moment.

## 2021-03-15 NOTE — ED Notes (Signed)
Called pt 3x to go back to a room, no response and patient is not in the lobby.

## 2021-03-15 NOTE — ED Triage Notes (Signed)
Pt reports she recently had a baby 4 months ago. Pt reports bleeding since. Recently had birth control implant. Pt thinks she may have a low potassium. Reports nausea, vomiting over the last day. Also may have UTI. VSS, NAD at present.

## 2021-08-09 ENCOUNTER — Encounter (HOSPITAL_COMMUNITY): Payer: Self-pay | Admitting: Emergency Medicine

## 2021-08-09 ENCOUNTER — Emergency Department (HOSPITAL_COMMUNITY)
Admission: EM | Admit: 2021-08-09 | Discharge: 2021-08-09 | Disposition: A | Discharge status: Home / Self Care | Payer: Self-pay | Attending: Emergency Medicine | Admitting: Emergency Medicine

## 2021-08-09 ENCOUNTER — Other Ambulatory Visit: Payer: Self-pay

## 2021-08-09 ENCOUNTER — Emergency Department (HOSPITAL_COMMUNITY): Payer: Self-pay

## 2021-08-09 DIAGNOSIS — Y900 Blood alcohol level of less than 20 mg/100 ml: Secondary | ICD-10-CM | POA: Insufficient documentation

## 2021-08-09 DIAGNOSIS — S022XXA Fracture of nasal bones, initial encounter for closed fracture: Secondary | ICD-10-CM | POA: Insufficient documentation

## 2021-08-09 DIAGNOSIS — S0285XA Fracture of orbit, unspecified, initial encounter for closed fracture: Secondary | ICD-10-CM | POA: Insufficient documentation

## 2021-08-09 DIAGNOSIS — F10920 Alcohol use, unspecified with intoxication, uncomplicated: Secondary | ICD-10-CM

## 2021-08-09 DIAGNOSIS — D72829 Elevated white blood cell count, unspecified: Secondary | ICD-10-CM | POA: Insufficient documentation

## 2021-08-09 DIAGNOSIS — W19XXXA Unspecified fall, initial encounter: Secondary | ICD-10-CM

## 2021-08-09 DIAGNOSIS — W01198A Fall on same level from slipping, tripping and stumbling with subsequent striking against other object, initial encounter: Secondary | ICD-10-CM | POA: Insufficient documentation

## 2021-08-09 DIAGNOSIS — S02401A Maxillary fracture, unspecified, initial encounter for closed fracture: Secondary | ICD-10-CM | POA: Insufficient documentation

## 2021-08-09 DIAGNOSIS — F10129 Alcohol abuse with intoxication, unspecified: Secondary | ICD-10-CM | POA: Insufficient documentation

## 2021-08-09 LAB — CBC WITH DIFFERENTIAL/PLATELET
Abs Immature Granulocytes: 0.08 10*3/uL — ABNORMAL HIGH (ref 0.00–0.07)
Basophils Absolute: 0.1 10*3/uL (ref 0.0–0.1)
Basophils Relative: 0 %
Eosinophils Absolute: 0.1 10*3/uL (ref 0.0–0.5)
Eosinophils Relative: 1 %
HCT: 44.4 % (ref 36.0–46.0)
Hemoglobin: 14.9 g/dL (ref 12.0–15.0)
Immature Granulocytes: 1 %
Lymphocytes Relative: 13 %
Lymphs Abs: 2 10*3/uL (ref 0.7–4.0)
MCH: 31.3 pg (ref 26.0–34.0)
MCHC: 33.6 g/dL (ref 30.0–36.0)
MCV: 93.3 fL (ref 80.0–100.0)
Monocytes Absolute: 0.8 10*3/uL (ref 0.1–1.0)
Monocytes Relative: 5 %
Neutro Abs: 12.2 10*3/uL — ABNORMAL HIGH (ref 1.7–7.7)
Neutrophils Relative %: 80 %
Platelets: 217 10*3/uL (ref 150–400)
RBC: 4.76 MIL/uL (ref 3.87–5.11)
RDW: 12.6 % (ref 11.5–15.5)
WBC: 15.3 10*3/uL — ABNORMAL HIGH (ref 4.0–10.5)
nRBC: 0 % (ref 0.0–0.2)

## 2021-08-09 LAB — ETHANOL: Alcohol, Ethyl (B): 293 mg/dL — ABNORMAL HIGH (ref <10)

## 2021-08-09 LAB — POC URINE PREG, ED: Preg Test, Ur: NEGATIVE

## 2021-08-09 LAB — BASIC METABOLIC PANEL
Anion gap: 12 (ref 5–15)
BUN: 7 mg/dL (ref 6–20)
CO2: 20 mmol/L — ABNORMAL LOW (ref 22–32)
Calcium: 9.3 mg/dL (ref 8.9–10.3)
Chloride: 106 mmol/L (ref 98–111)
Creatinine, Ser: 0.7 mg/dL (ref 0.44–1.00)
GFR, Estimated: >60 mL/min (ref >60)
Glucose, Bld: 133 mg/dL — ABNORMAL HIGH (ref 70–99)
Potassium: 4 mmol/L (ref 3.5–5.1)
Sodium: 138 mmol/L (ref 135–145)

## 2021-08-09 NOTE — ED Triage Notes (Signed)
Pt reported to ED with c/o mechanical fall, pt reports incident happened "several hours ago". Pt has obvious swelling to right face. Denies any LOC, difficulty breathing, CP or ShOB at this time. Pt's husband reports pt ETOH+. Pt appears to have unsteady gait.

## 2021-08-09 NOTE — Discharge Instructions (Signed)
Please follow up with [facility] and Throat for further evaluation of your multiple facial fractures.   While at home please ice your face to help reduce pain/swelling. Take Ibuprofen and Tylenol as needed for pain. You can take 800 mg Ibuprofen every 8 hours as needed for pain and 1,000 mg Tylenol every 8 hours as needed for pain. Alternate these medications every 4 hours as needed.   Return to the ED for any new/worsening symptoms

## 2021-08-09 NOTE — ED Provider Notes (Signed)
[facility] EMERGENCY DEPARTMENT Provider Note   CSN: [id number] Arrival date & time: 08/09/21  1935     History  Chief Complaint  Patient presents with   Cassandra Thomas is a 31 y.o. female who presents to the ED today s/p mechanical fall that occurred earlier today. Pt states she is not sure exactly what happened  but remembers her dog pulling her/causing her to fall in the [place]port. She states she hit the right side of her face on the railing in the carport however does not believe she lost consciousness. She is not anticoagulated. Pt with obvious swelling/bruising to the right zygomatic arch/inferior orbital rim. She complains of pain to this area. She has dried blood to her face. States her tetanus is UTD.   The history is provided by the patient and medical records.      Home Medications Prior to Admission medications   Medication Sig Start Date End Date Taking? Authorizing Provider  acetaminophen (TYLENOL) 500 MG tablet Take 2 tablets (1,000 mg total) by mouth every 8 (eight) hours. 11/26/20   Rosalio Loud, MD  ibuprofen (ADVIL) 600 MG tablet Take 1 tablet (600 mg total) by mouth every 6 (six) hours. 11/26/20   Rosalio Loud, MD  oxyCODONE (OXY IR/ROXICODONE) 5 MG immediate release tablet Take 1-2 tablets (5-10 mg total) by mouth every 4 (four) hours as needed for moderate pain. Patient not taking: Reported on 01/02/2021 11/26/20   Gita Kudo, MD  Prenatal Vit-Fe Fumarate-FA (PRENATAL MULTIVITAMIN) TABS tablet Take 1 tablet by mouth daily at 12 noon.    [provider]      Allergies    Codeine    Review of Systems   Review of Systems  Musculoskeletal:  Positive for arthralgias.  Skin:  Positive for color change (bruising).  Neurological:  Positive for headaches. Negative for syncope.  All other systems reviewed and are negative.  Physical Exam Updated Vital Signs BP 120/82    Pulse 83    Temp 97.8 F (36.6 C)  (Oral)    Resp 20    Ht 5\' 9"  (1.753 m)    Wt 47.6 kg    SpO2 100%    BMI 15.51 kg/m  Physical Exam Vitals and nursing note reviewed.  Constitutional:      Appearance: She is not ill-appearing or diaphoretic.  HENT:     Head: Normocephalic.     Comments: Large area of swelling and ecchymosis with associated TTP to right zygomatic arch/inferior orbital rim.     Nose:     Comments: Dried blood to bilateral naris Eyes:     Extraocular Movements: Extraocular movements intact.     Conjunctiva/sclera: Conjunctivae normal.     Pupils: Pupils are equal, round, and reactive to light.     Comments: No hyphema appreciated  Cardiovascular:     Rate and Rhythm: Normal rate and regular rhythm.  Pulmonary:     Effort: Pulmonary effort is normal.     Breath sounds: Normal breath sounds. No wheezing, rhonchi or rales.  Chest:     Chest wall: No tenderness.  Abdominal:     Palpations: Abdomen is soft.     Tenderness: There is no abdominal tenderness.  Musculoskeletal:        General: Tenderness present.     Cervical back: Neck supple.     Comments: + bruising, swelling, and TTP to R elbow. ROM intact to elbow. No TTP distally  or proximally. Strength and sensation intact. 2+ radial pulse.   Skin:    General: Skin is warm and dry.  Neurological:     General: No focal deficit present.     Mental Status: She is alert and oriented to person, place, and time.    ED Results / Procedures / Treatments   Labs (all labs ordered are listed, but only abnormal results are displayed) Labs Reviewed  CBC WITH DIFFERENTIAL/PLATELET - Abnormal; Notable for the following components:      Result Value   WBC 15.3 (*)    Neutro Abs 12.2 (*)    Abs Immature Granulocytes 0.08 (*)    All other components within normal limits  BASIC METABOLIC PANEL - Abnormal; Notable for the following components:   CO2 20 (*)    Glucose, Bld 133 (*)    All other components within normal limits  ETHANOL - Abnormal; Notable for  the following components:   Alcohol, Ethyl (B) 293 (*)    All other components within normal limits  I-STAT BETA HCG BLOOD, ED ([facility], [facility], [facility] ONLY)  POC URINE PREG, ED    EKG None  Radiology CT HEAD WO CONTRAST ( )  Result Date: 08/09/2021 CLINICAL DATA:  Fall, facial trauma and neck trauma. EXAM: CT HEAD WITHOUT CONTRAST CT MAXILLOFACIAL WITHOUT CONTRAST CT CERVICAL SPINE WITHOUT CONTRAST TECHNIQUE: Multidetector CT imaging of the head, cervical spine, and maxillofacial structures were performed using the standard protocol without intravenous contrast. Multiplanar CT image reconstructions of the cervical spine and maxillofacial structures were also generated. RADIATION DOSE REDUCTION: This exam was performed according to the departmental dose-optimization program which includes automated exposure control, adjustment of the mA and/or kV according to patient size and/or use of iterative reconstruction technique. COMPARISON:  None. FINDINGS: CT HEAD FINDINGS Brain: No acute intracranial hemorrhage, midline shift or mass effect. No extra-axial fluid collection. Gray-white matter differentiation is within normal limits. There is no hydrocephalus. Vascular: No hyperdense vessel or unexpected calcification. Skull: Normal. Negative for fracture or focal lesion. Other: None. CT MAXILLOFACIAL FINDINGS Osseous: There is a slightly displaced fracture of the nasal bone on the right. There are fractures of the inferior wall of the right orbit and anterior and medial walls of the right maxillary sinus. Orbits: The globes, optic nerves, and extraocular muscles are symmetric. No retrobulbar fat stranding is seen. Sinuses: There is near complete opacification of the right maxillary sinus with evidence of blood products. The remaining visualized paranasal sinuses are clear. There is partial opacification of the ethmoid air cells on the right. Soft tissues: A subcutaneous hematoma is present over the right orbit, cheek,  and jaw. CT CERVICAL SPINE FINDINGS Alignment: Normal. Skull base and vertebrae: No acute fracture. No primary bone lesion or focal pathologic process. Soft tissues and spinal canal: No prevertebral fluid or swelling. No visible canal hematoma. Disc levels: Intervertebral disc space is maintained. No significant spinal canal or neural foraminal stenosis. Upper chest: Negative. Other: None. IMPRESSION: 1. No acute intracranial process. 2. Fractures of the medial and anterior walls of the maxillary sinus and inferior orbital wall on the right. No evidence of extraocular muscle entrapment. 3. Slightly displaced nasal bone fracture on the right. 4. No cervical spine fracture. Electronically Signed   By: Thornell Sartorius M.D.   On: 08/09/2021 21:50   CT Cervical Spine Wo Contrast  Result Date: 08/09/2021 CLINICAL DATA:  Fall, facial trauma and neck trauma. EXAM: CT HEAD WITHOUT CONTRAST CT MAXILLOFACIAL WITHOUT CONTRAST CT CERVICAL SPINE  WITHOUT CONTRAST TECHNIQUE: Multidetector CT imaging of the head, cervical spine, and maxillofacial structures were performed using the standard protocol without intravenous contrast. Multiplanar CT image reconstructions of the cervical spine and maxillofacial structures were also generated. RADIATION DOSE REDUCTION: This exam was performed according to the departmental dose-optimization program which includes automated exposure control, adjustment of the mA and/or kV according to patient size and/or use of iterative reconstruction technique. COMPARISON:  None. FINDINGS: CT HEAD FINDINGS Brain: No acute intracranial hemorrhage, midline shift or mass effect. No extra-axial fluid collection. Gray-white matter differentiation is within normal limits. There is no hydrocephalus. Vascular: No hyperdense vessel or unexpected calcification. Skull: Normal. Negative for fracture or focal lesion. Other: None. CT MAXILLOFACIAL FINDINGS Osseous: There is a slightly displaced fracture of the nasal  bone on the right. There are fractures of the inferior wall of the right orbit and anterior and medial walls of the right maxillary sinus. Orbits: The globes, optic nerves, and extraocular muscles are symmetric. No retrobulbar fat stranding is seen. Sinuses: There is near complete opacification of the right maxillary sinus with evidence of blood products. The remaining visualized paranasal sinuses are clear. There is partial opacification of the ethmoid air cells on the right. Soft tissues: A subcutaneous hematoma is present over the right orbit, cheek, and jaw. CT CERVICAL SPINE FINDINGS Alignment: Normal. Skull base and vertebrae: No acute fracture. No primary bone lesion or focal pathologic process. Soft tissues and spinal canal: No prevertebral fluid or swelling. No visible canal hematoma. Disc levels: Intervertebral disc space is maintained. No significant spinal canal or neural foraminal stenosis. Upper chest: Negative. Other: None. IMPRESSION: 1. No acute intracranial process. 2. Fractures of the medial and anterior walls of the maxillary sinus and inferior orbital wall on the right. No evidence of extraocular muscle entrapment. 3. Slightly displaced nasal bone fracture on the right. 4. No cervical spine fracture. Electronically Signed   By: Thornell SartoriusLaura  Taylor M.D.   On: 08/09/2021 21:50   CT Maxillofacial Wo Contrast  Result Date: 08/09/2021 CLINICAL DATA:  Fall, facial trauma and neck trauma. EXAM: CT HEAD WITHOUT CONTRAST CT MAXILLOFACIAL WITHOUT CONTRAST CT CERVICAL SPINE WITHOUT CONTRAST TECHNIQUE: Multidetector CT imaging of the head, cervical spine, and maxillofacial structures were performed using the standard protocol without intravenous contrast. Multiplanar CT image reconstructions of the cervical spine and maxillofacial structures were also generated. RADIATION DOSE REDUCTION: This exam was performed according to the departmental dose-optimization program which includes automated exposure control,  adjustment of the mA and/or kV according to patient size and/or use of iterative reconstruction technique. COMPARISON:  None. FINDINGS: CT HEAD FINDINGS Brain: No acute intracranial hemorrhage, midline shift or mass effect. No extra-axial fluid collection. Gray-white matter differentiation is within normal limits. There is no hydrocephalus. Vascular: No hyperdense vessel or unexpected calcification. Skull: Normal. Negative for fracture or focal lesion. Other: None. CT MAXILLOFACIAL FINDINGS Osseous: There is a slightly displaced fracture of the nasal bone on the right. There are fractures of the inferior wall of the right orbit and anterior and medial walls of the right maxillary sinus. Orbits: The globes, optic nerves, and extraocular muscles are symmetric. No retrobulbar fat stranding is seen. Sinuses: There is near complete opacification of the right maxillary sinus with evidence of blood products. The remaining visualized paranasal sinuses are clear. There is partial opacification of the ethmoid air cells on the right. Soft tissues: A subcutaneous hematoma is present over the right orbit, cheek, and jaw. CT CERVICAL SPINE FINDINGS Alignment: Normal. Skull  base and vertebrae: No acute fracture. No primary bone lesion or focal pathologic process. Soft tissues and spinal canal: No prevertebral fluid or swelling. No visible canal hematoma. Disc levels: Intervertebral disc space is maintained. No significant spinal canal or neural foraminal stenosis. Upper chest: Negative. Other: None. IMPRESSION: 1. No acute intracranial process. 2. Fractures of the medial and anterior walls of the maxillary sinus and inferior orbital wall on the right. No evidence of extraocular muscle entrapment. 3. Slightly displaced nasal bone fracture on the right. 4. No cervical spine fracture. Electronically Signed   By: Thornell Sartorius M.D.   On: 08/09/2021 21:50    Procedures Procedures    Medications Ordered in ED Medications - No  data to display  ED Course/ Medical Decision Making/ A&P Clinical Course as of 08/09/21 2230  Wed Aug 09, 2021  2203 Follow up [facility]  [MV]    Clinical Course User Index [MV] Tanda Rockers, PA-C                           Medical Decision Making 31 year old female who presents to the ED today status post mechanical fall secondary to alcohol intoxication that occurred earlier.  Noted to have significant amount of swelling, tenderness palpation, bruising to the right zygomatic arch.  She was medically screened in triage and work-up started including CBC, BMP, beta hCG, EtOH as well as CT head, maxillofacial, C-spine.   CBC with slightly elevated leukocytosis at 15,300.  Suspect secondary to acute phase reactant/pain. BMP with glucose 133, bicarb 20.  No other electrolyte abnormalities.  Suspect likely secondary to acute alcohol intoxication. Alcohol level elevated at 293. Beta-hCG never collected.  Change to point-of-care urine pregnancy, negative.  CT maxillofacial with fractures of the medial and anterior walls of maxillary sinus, inferior orbital wall on right side, displaced nasal bone fracture on right.   On exam she is also noted to have point tenderness palpation, ecchymosis, swelling to her right elbow.  Had initially discussed elbow x-ray with patient and she was in agreement however when x-ray tech came to collect patient patient began yelling at her stating she does not need an x-ray.  I attempted to have lengthy discussion with patient regarding fact that this could be fractured and we are unable to rule this out at this time.  Patient again declines.  She does appear to be in her right state of mind currently despite being slightly intoxicated.  Husband is on the phone with her currently.  Will DC x-ray elbow at this time.  We will plan to discuss case with ENT given findings.    Problems Addressed: Alcoholic intoxication without complication [facility]): acute illness or  injury Closed fracture of maxillary sinus, initial encounter [facility]): acute illness or injury Closed fracture of nasal bone, initial encounter: acute illness or injury Closed fracture of orbit, initial encounter [facility]): acute illness or injury Fall, initial encounter: acute illness or injury  Amount and/or Complexity of Data Reviewed Radiology: ordered. Discussion of management or test interpretation with external provider(s): Discussed case with Dr. Jearld Fenton ENT. Given orbital fracture does not appear to be displaced without signs of entrapment does not require follow up however with her nasal fracture she can follow up for further eval. Pt to be provide outpatient number to call.           Final Clinical Impression(s) / ED Diagnoses Final diagnoses:  Fall, initial encounter  Closed fracture of nasal bone,  initial encounter  Closed fracture of maxillary sinus, initial encounter (HCC)  Closed fracture of orbit, initial encounter (HCC)  Alcoholic intoxication without complication (HCC)    Rx / DC Orders ED Discharge Orders     None        Discharge Instructions      [facility] for further evaluation of your multiple facial fractures.   While at home please ice your face to help reduce pain/swelling. Take Ibuprofen and Tylenol as needed for pain. You can take 800 mg Ibuprofen every 8 hours as needed for pain and 1,000 mg Tylenol every 8 hours as needed for pain. Alternate these medications every 4 hours as needed.   Return to the ED for any new/worsening symptoms        Tanda RockersVenter, Edin Kon, Cordelia Poche-C 08/09/21 2230    Terrilee FilesButler, Michael C, MD 08/10/21 [phone]

## 2021-08-09 NOTE — ED Notes (Signed)
Patient verbalizes understanding of discharge instructions. Opportunity for questioning and answers were provided. Armband removed by staff, pt discharged from ED ambulatory.   

## 2021-08-09 NOTE — ED Provider Triage Note (Signed)
Emergency Medicine Provider Triage Evaluation Note  Cassandra Thomas , a 31 y.o. female  was evaluated in triage.  Pt complains of fall.  Apparently had EtOH on board subsequently fell on the right side of her head and face.  Had some epistaxis which resolved currently.  No loose dentition.  She denies any chest pain, abdominal pain.  No syncope  Review of Systems  Positive: Facial pain, headache Negative:   Physical Exam  BP (!) 129/91 (BP Location: Right Arm)    Pulse (!) 129    Temp 97.8 F (36.6 C) (Oral)    Resp (!) 22    SpO2 100%  Gen:   Awake, no distress  Head:  Right-sided zygomatic swelling, tenderness.  Dried epistaxis bilateral nares.  No drooling, dysphagia or trismus Resp:  Normal effort  MSK:   Moves extremities without difficulty  Other:    Medical Decision Making  Medically screening exam initiated at 7:43 PM.  Appropriate orders placed.  Cassandra Thomas was informed that the remainder of the evaluation will be completed by another provider, this initial triage assessment does not replace that evaluation, and the importance of remaining in the ED until their evaluation is complete.  Fall, facial injury   Cassandra Thomas A, PA-C 08/09/21 1944

## 2021-08-09 NOTE — ED Notes (Addendum)
Pt ambulatory around unit stating need to leave - MD notfied - Pt reports to this RN that she is in no pain but "pissed off" - pt states that she needs to leave because she has children at home - pt states that she does have a ride to get home - pt would really like to leave

## 2022-09-21 IMAGING — CT CT MAXILLOFACIAL W/O CM
4 series · 16 of 47 positions shown, 18 images · non-contrast
Comparison: None.

CLINICAL DATA: Fall, facial trauma and neck trauma.



[Series 3: facial/ orbits 2.0 h30s · axial · 0.32mm/px · z∈[+45,+147]mm · 8 of 67 slices shown, 10 images]
[im 8/67  brain]
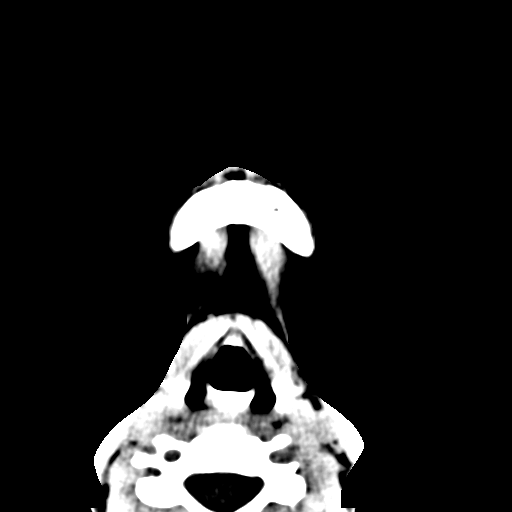
[im 8/67  bone]
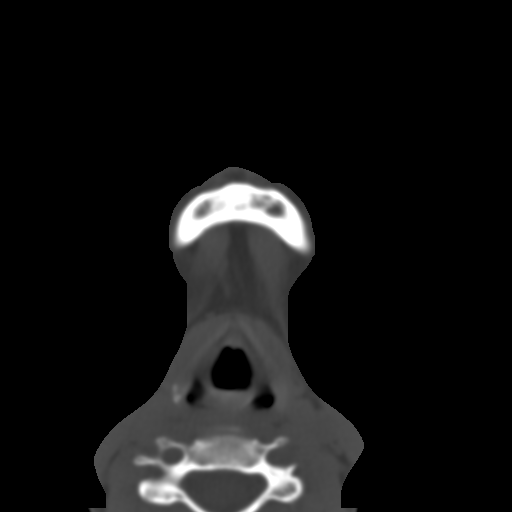
[im 15/67  bone]
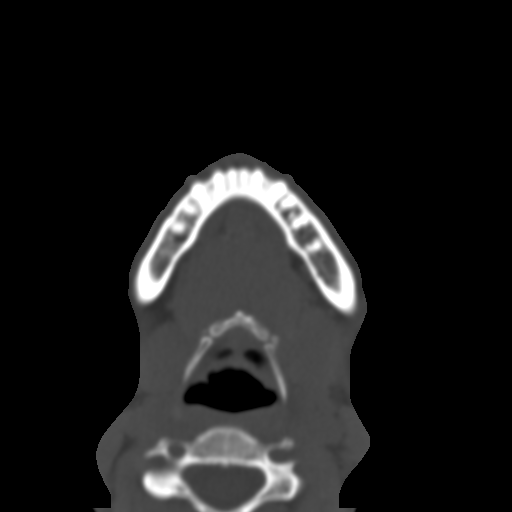
[im 23/67  bone]
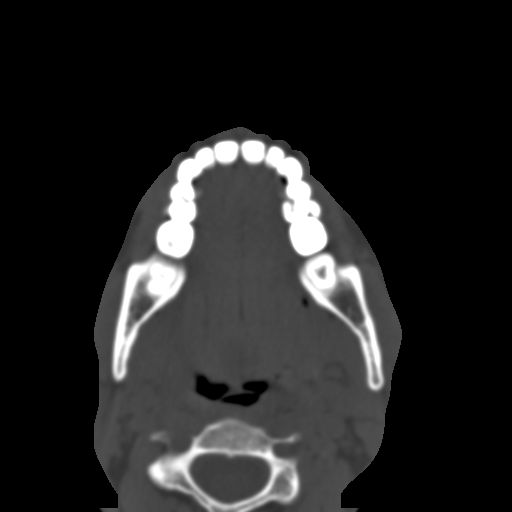
[im 30/67  bone]
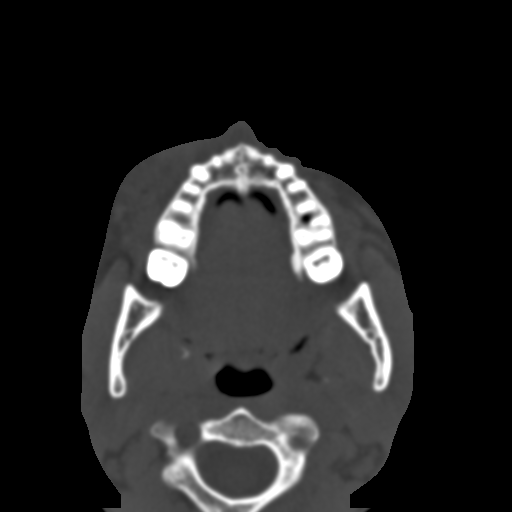
[im 37/67  brain]
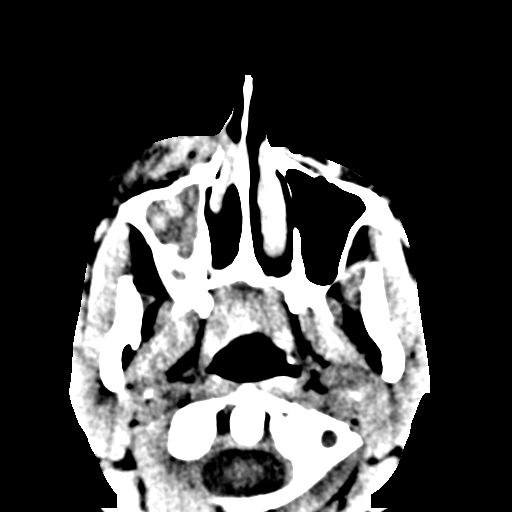
[im 37/67  bone]
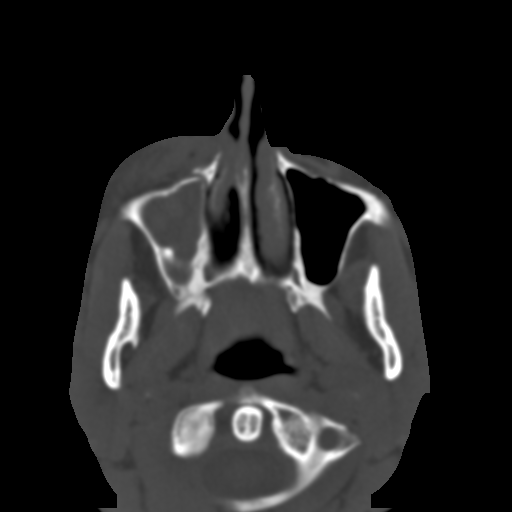
[im 45/67  bone]
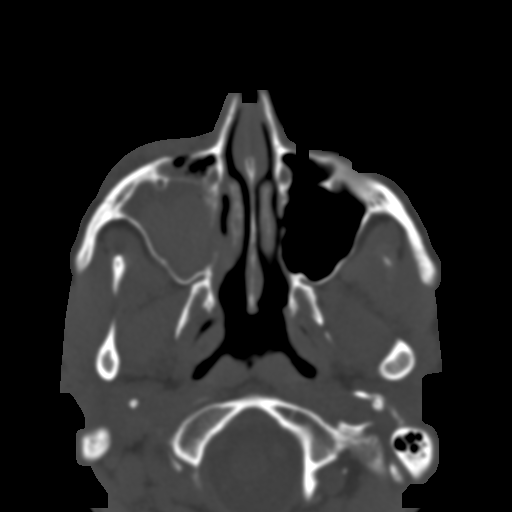
[im 52/67  bone]
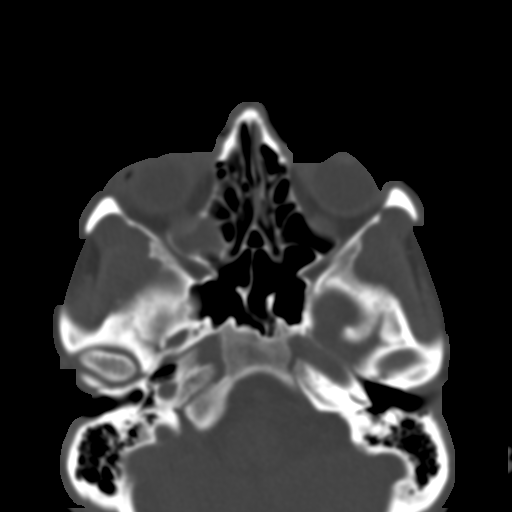
[im 59/67  bone]
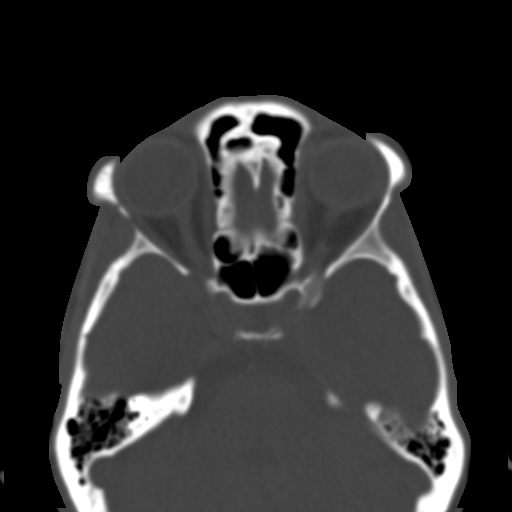

[Series 5: 1.0 thin soft tissue · axial · 0.32mm/px · z∈[+44,+58]mm · 2 of 133 slices shown]
[im 14/133  brain]
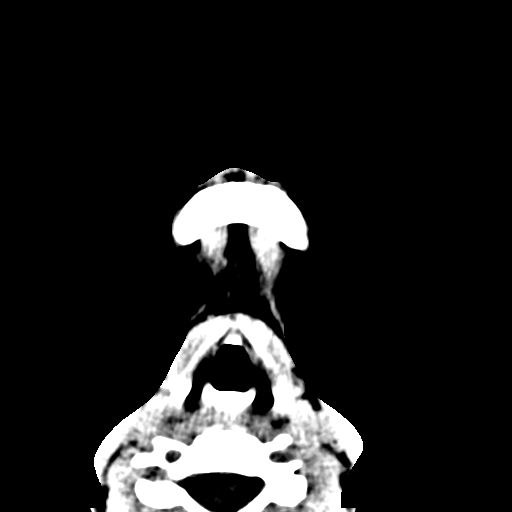
[im 28/133  brain]
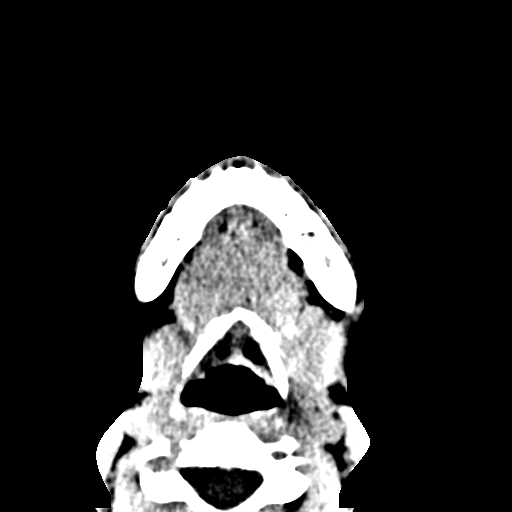

[Series 7: coronal soft tissue · coronal · 0.27mm/px · 3 of 76 slices shown]
[im 26/76  bone]
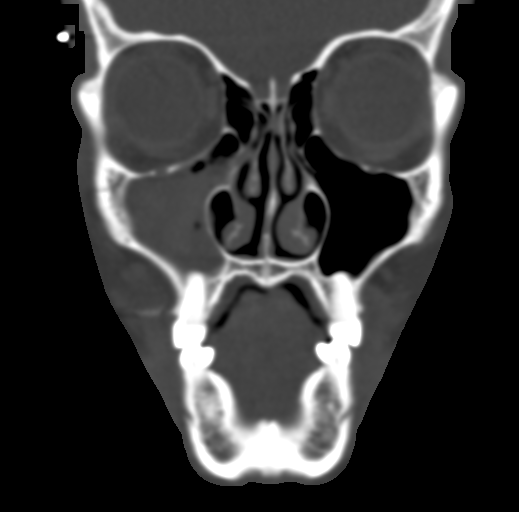
[im 34/76  bone]
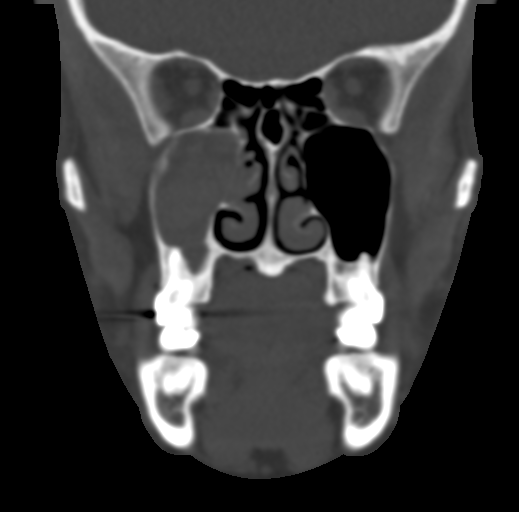
[im 42/76  bone]
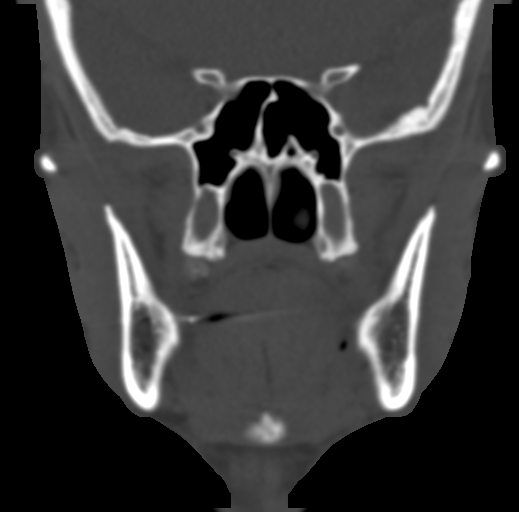

[Series 9: sagittal soft tissue · sagittal · 0.27mm/px · 3 of 72 slices shown]
[im 24/72  bone]
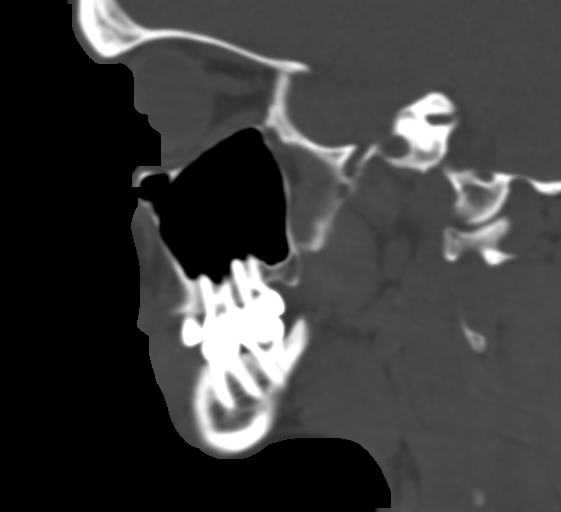
[im 36/72  bone]
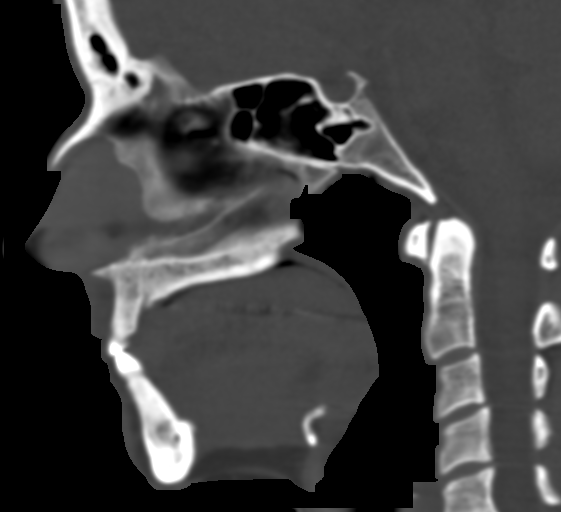
[im 48/72  bone]
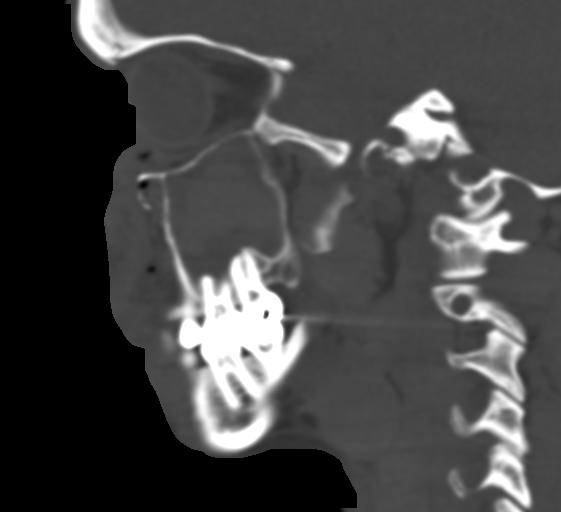

[16 of 47 positions shown; findings below may reference images not displayed]

FINDINGS: CT HEAD FINDINGS

Brain: No acute intracranial hemorrhage, midline shift or mass
effect. No extra-axial fluid collection. Gray-white matter
differentiation is within normal limits. There is no hydrocephalus.

Vascular: No hyperdense vessel or unexpected calcification.

Skull: Normal. Negative for fracture or focal lesion.

Other: None.

CT MAXILLOFACIAL FINDINGS

Osseous: There is a slightly displaced fracture of the nasal bone on
the right. There are fractures of the inferior wall of the right
orbit and anterior and medial walls of the right maxillary sinus.

Orbits: The globes, optic nerves, and extraocular muscles are
symmetric. No retrobulbar fat stranding is seen.

Sinuses: There is near complete opacification of the right maxillary
sinus with evidence of blood products. The remaining visualized
paranasal sinuses are clear. There is partial opacification of the
ethmoid air cells on the right.

Soft tissues: A subcutaneous hematoma is present over the right
orbit, cheek, and jaw.

CT CERVICAL SPINE FINDINGS

Alignment: Normal.

Skull base and vertebrae: No acute fracture. No primary bone lesion
or focal pathologic process.

Soft tissues and spinal canal: No prevertebral fluid or swelling. No
visible canal hematoma.

Disc levels: Intervertebral disc space is maintained. No significant
spinal canal or neural foraminal stenosis.

Upper chest: Negative.

Other: None.
IMPRESSION: 1. No acute intracranial process.
2. Fractures of the medial and anterior walls of the maxillary sinus
and inferior orbital wall on the right. No evidence of extraocular
muscle entrapment.
3. Slightly displaced nasal bone fracture on the right.
4. No cervical spine fracture.

## 2022-09-21 IMAGING — CT CT HEAD W/O CM
3 of 4 series · 14 of 47 positions shown, 16 images · non-contrast
Comparison: None.

CLINICAL DATA: Fall, facial trauma and neck trauma.



[Series 3: head 2.0 h70h · axial · 0.42mm/px · z∈[+144,+262]mm · 8 of 75 slices shown, 10 images]
[im 8/75  brain]
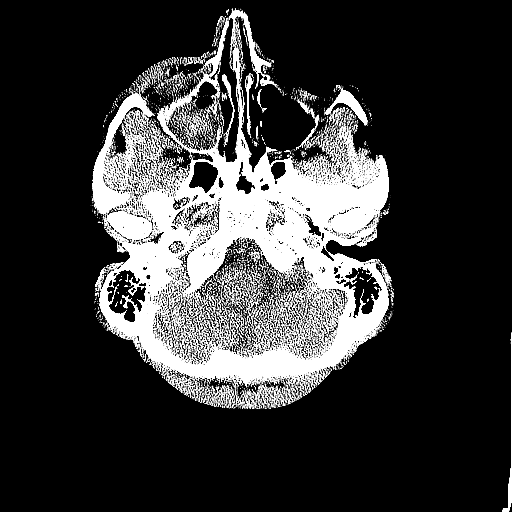
[im 8/75  bone]
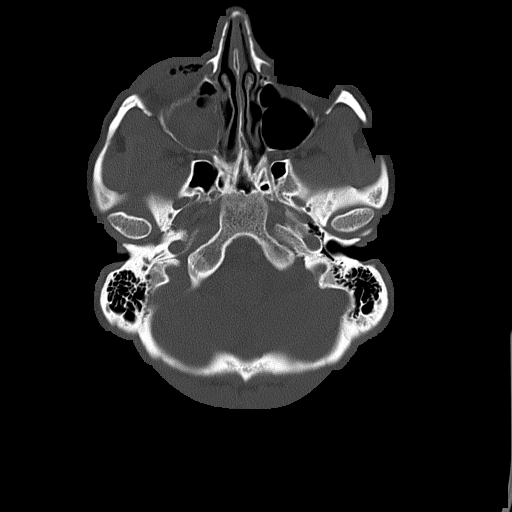
[im 15/75  brain]
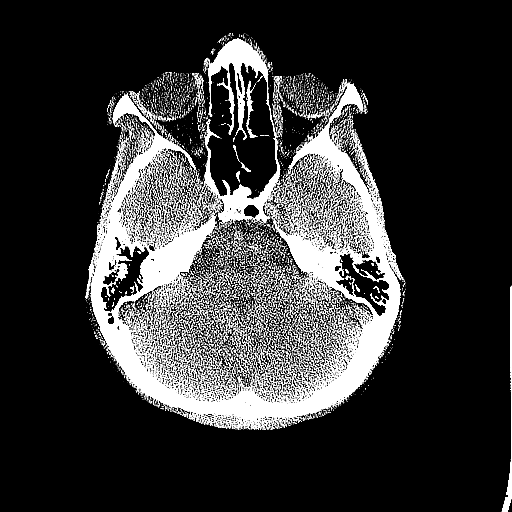
[im 23/75  brain]
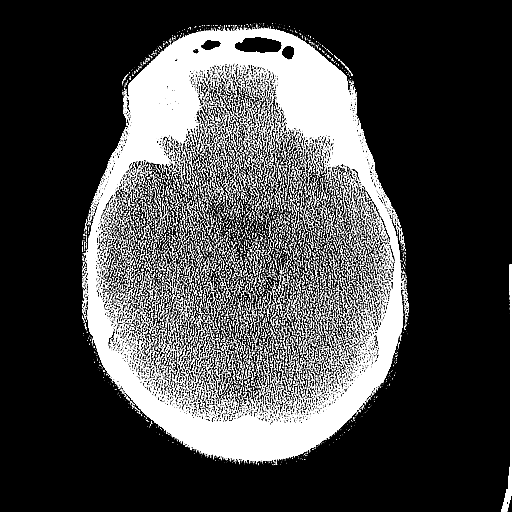
[im 34/75  brain]
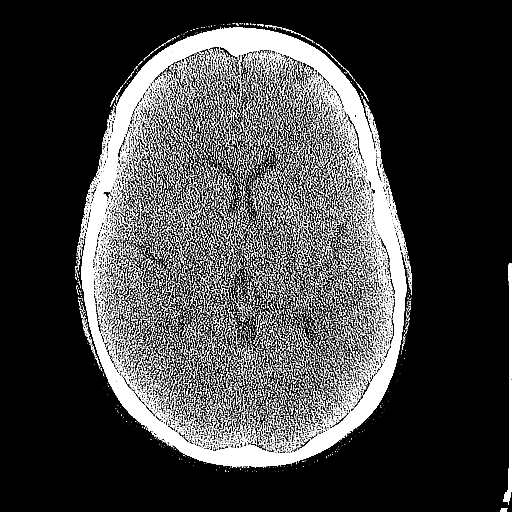
[im 41/75  brain]
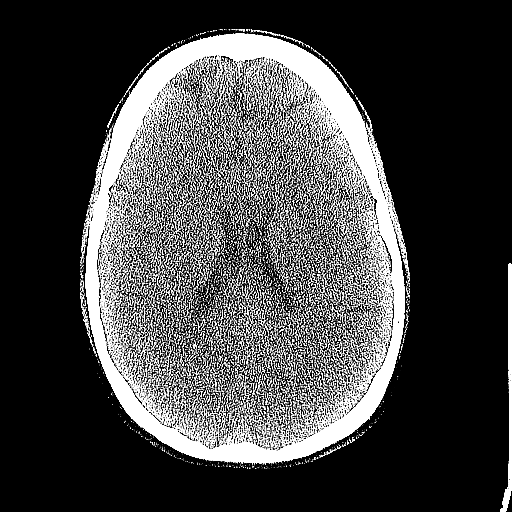
[im 41/75  bone]
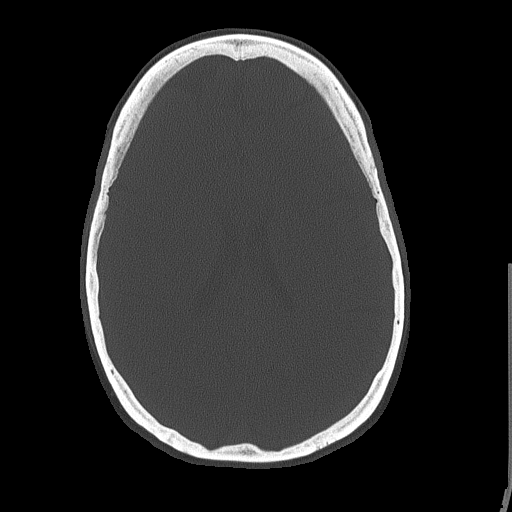
[im 52/75  brain]
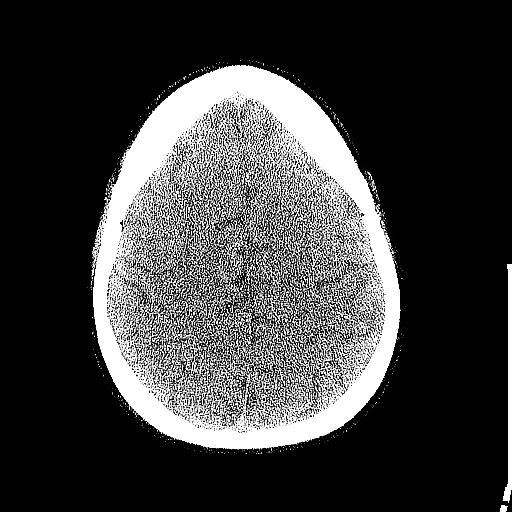
[im 60/75  brain]
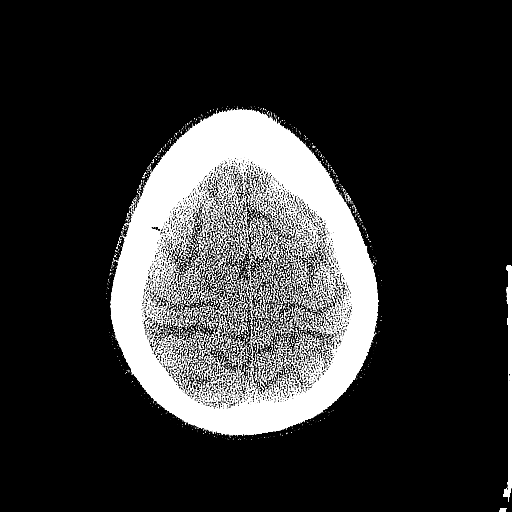
[im 67/75  brain]
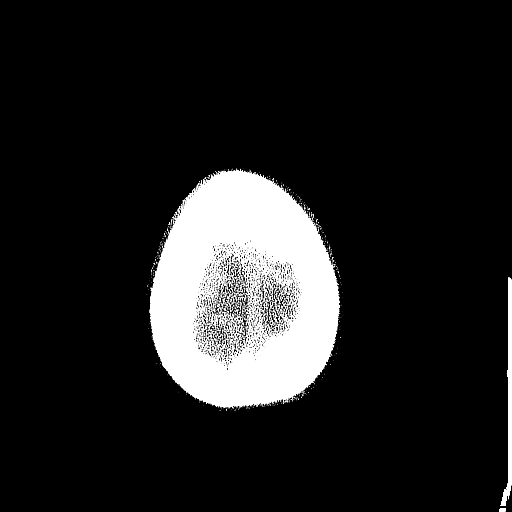

[Series 5: head 3.0 mpr cor · coronal · 0.30mm/px · 3 of 65 slices shown]
[im 22/65  brain]
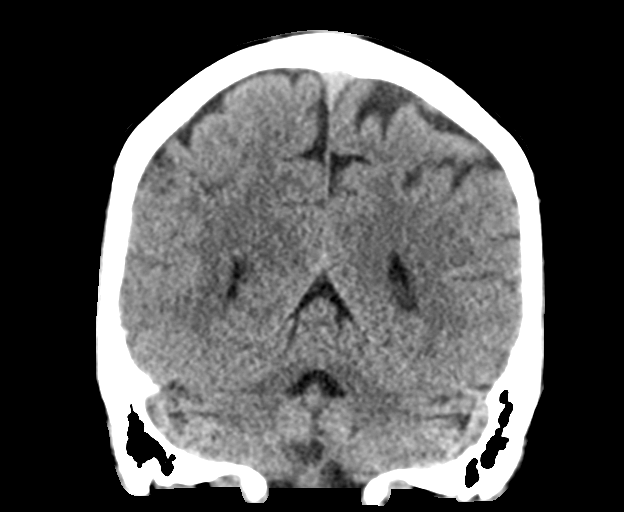
[im 29/65  brain]
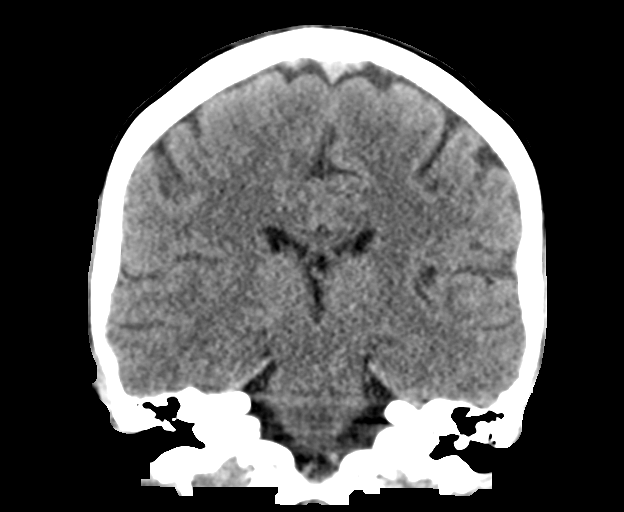
[im 36/65  brain]
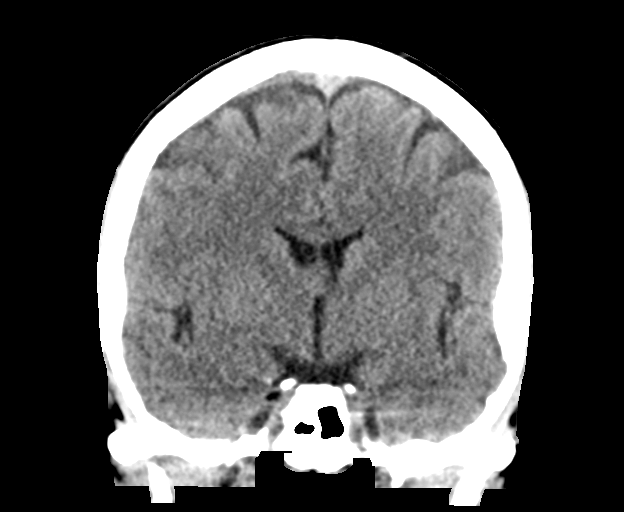

[Series 6: head 3.0 mpr sag · sagittal · 0.30mm/px · 3 of 50 slices shown]
[im 17/50  brain]
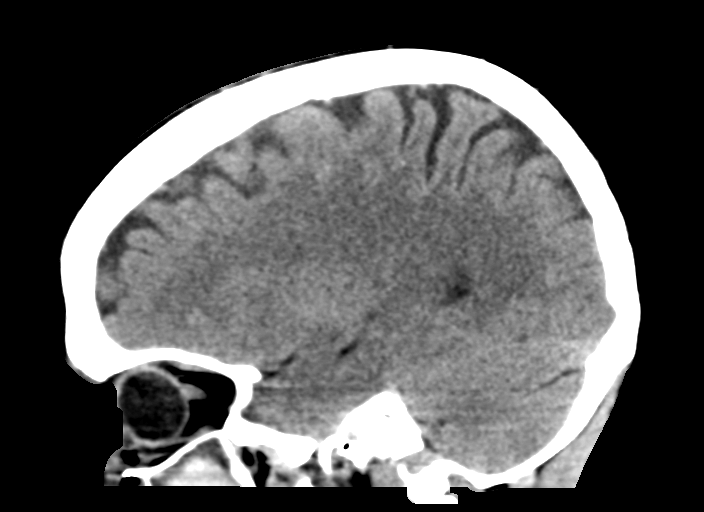
[im 25/50  brain]
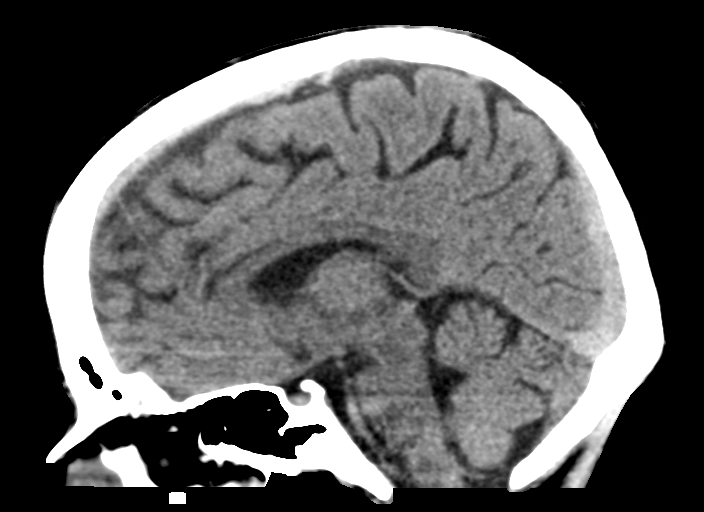
[im 33/50  brain]
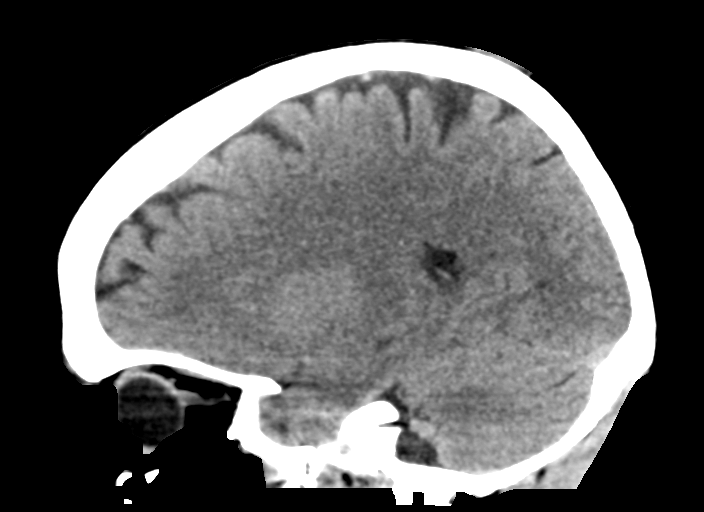

[14 of 47 positions shown; findings below may reference images not displayed]

FINDINGS: CT HEAD FINDINGS

Brain: No acute intracranial hemorrhage, midline shift or mass
effect. No extra-axial fluid collection. Gray-white matter
differentiation is within normal limits. There is no hydrocephalus.

Vascular: No hyperdense vessel or unexpected calcification.

Skull: Normal. Negative for fracture or focal lesion.

Other: None.

CT MAXILLOFACIAL FINDINGS

Osseous: There is a slightly displaced fracture of the nasal bone on
the right. There are fractures of the inferior wall of the right
orbit and anterior and medial walls of the right maxillary sinus.

Orbits: The globes, optic nerves, and extraocular muscles are
symmetric. No retrobulbar fat stranding is seen.

Sinuses: There is near complete opacification of the right maxillary
sinus with evidence of blood products. The remaining visualized
paranasal sinuses are clear. There is partial opacification of the
ethmoid air cells on the right.

Soft tissues: A subcutaneous hematoma is present over the right
orbit, cheek, and jaw.

CT CERVICAL SPINE FINDINGS

Alignment: Normal.

Skull base and vertebrae: No acute fracture. No primary bone lesion
or focal pathologic process.

Soft tissues and spinal canal: No prevertebral fluid or swelling. No
visible canal hematoma.

Disc levels: Intervertebral disc space is maintained. No significant
spinal canal or neural foraminal stenosis.

Upper chest: Negative.

Other: None.
IMPRESSION: 1. No acute intracranial process.
2. Fractures of the medial and anterior walls of the maxillary sinus
and inferior orbital wall on the right. No evidence of extraocular
muscle entrapment.
3. Slightly displaced nasal bone fracture on the right.
4. No cervical spine fracture.

## 2023-04-23 ENCOUNTER — Ambulatory Visit: Payer: Medicaid Other | Admitting: Family Medicine
